# Patient Record
Sex: Female | Born: 1965 | Race: White | Hispanic: No | Marital: Married | Smoking: Current every day smoker
Health system: Southern US, Community
[De-identification: ages and names within clinical notes are randomized; demographics above are authoritative.]

## PROBLEM LIST (undated history)

## (undated) DIAGNOSIS — J189 Pneumonia, unspecified organism: Secondary | ICD-10-CM

## (undated) DIAGNOSIS — K219 Gastro-esophageal reflux disease without esophagitis: Secondary | ICD-10-CM

## (undated) DIAGNOSIS — J449 Chronic obstructive pulmonary disease, unspecified: Secondary | ICD-10-CM

## (undated) DIAGNOSIS — F32A Depression, unspecified: Secondary | ICD-10-CM

## (undated) DIAGNOSIS — F329 Major depressive disorder, single episode, unspecified: Secondary | ICD-10-CM

## (undated) DIAGNOSIS — Z87442 Personal history of urinary calculi: Secondary | ICD-10-CM

## (undated) DIAGNOSIS — R109 Unspecified abdominal pain: Secondary | ICD-10-CM

## (undated) DIAGNOSIS — Z8711 Personal history of peptic ulcer disease: Secondary | ICD-10-CM

## (undated) DIAGNOSIS — Z973 Presence of spectacles and contact lenses: Secondary | ICD-10-CM

## (undated) DIAGNOSIS — R112 Nausea with vomiting, unspecified: Secondary | ICD-10-CM

## (undated) DIAGNOSIS — N200 Calculus of kidney: Secondary | ICD-10-CM

## (undated) DIAGNOSIS — R350 Frequency of micturition: Secondary | ICD-10-CM

## (undated) DIAGNOSIS — F419 Anxiety disorder, unspecified: Secondary | ICD-10-CM

## (undated) DIAGNOSIS — Z8719 Personal history of other diseases of the digestive system: Secondary | ICD-10-CM

## (undated) DIAGNOSIS — R319 Hematuria, unspecified: Secondary | ICD-10-CM

## (undated) DIAGNOSIS — Z9889 Other specified postprocedural states: Secondary | ICD-10-CM

## (undated) DIAGNOSIS — N201 Calculus of ureter: Secondary | ICD-10-CM

## (undated) DIAGNOSIS — Z8489 Family history of other specified conditions: Secondary | ICD-10-CM

## (undated) DIAGNOSIS — H409 Unspecified glaucoma: Secondary | ICD-10-CM

## (undated) DIAGNOSIS — R3915 Urgency of urination: Secondary | ICD-10-CM

## (undated) HISTORY — PX: FOOT SURGERY: SHX648

## (undated) HISTORY — PX: DILATION AND CURETTAGE OF UTERUS: SHX78

## (undated) HISTORY — PX: TONSILLECTOMY AND ADENOIDECTOMY: SUR1326

## (undated) HISTORY — PX: COLONOSCOPY: SHX174

## (undated) HISTORY — DX: Unspecified glaucoma: H40.9

---

## 1996-10-10 HISTORY — PX: CYSTOSCOPY/RETROGRADE/URETEROSCOPY/STONE EXTRACTION WITH BASKET: SHX5317

## 2000-10-10 HISTORY — PX: APPENDECTOMY: SHX54

## 2008-10-10 HISTORY — PX: OTHER SURGICAL HISTORY: SHX169

## 2008-10-10 HISTORY — PX: VAGINAL HYSTERECTOMY: SUR661

## 2013-08-21 ENCOUNTER — Ambulatory Visit (INDEPENDENT_AMBULATORY_CARE_PROVIDER_SITE_OTHER): Payer: BC Managed Care – PPO | Admitting: Psychiatry

## 2013-08-21 ENCOUNTER — Encounter (HOSPITAL_COMMUNITY): Payer: Self-pay | Admitting: Psychiatry

## 2013-08-21 VITALS — BP 130/84 | Ht 63.0 in | Wt 118.0 lb

## 2013-08-21 DIAGNOSIS — F1010 Alcohol abuse, uncomplicated: Secondary | ICD-10-CM

## 2013-08-21 DIAGNOSIS — F4310 Post-traumatic stress disorder, unspecified: Secondary | ICD-10-CM
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
## 2013-09-18 ENCOUNTER — Ambulatory Visit (HOSPITAL_COMMUNITY): Payer: BC Managed Care – PPO | Admitting: Psychiatry

## 2015-08-11 ENCOUNTER — Other Ambulatory Visit (HOSPITAL_COMMUNITY): Payer: Self-pay | Admitting: Family Medicine

## 2015-08-11 DIAGNOSIS — Z1231 Encounter for screening mammogram for malignant neoplasm of breast: Secondary | ICD-10-CM

## 2015-08-24 ENCOUNTER — Ambulatory Visit (HOSPITAL_COMMUNITY): Payer: Self-pay

## 2015-09-09 ENCOUNTER — Ambulatory Visit (HOSPITAL_COMMUNITY): Payer: Self-pay

## 2017-01-09 ENCOUNTER — Emergency Department (HOSPITAL_COMMUNITY): Payer: Commercial Managed Care - HMO

## 2017-01-09 ENCOUNTER — Emergency Department (HOSPITAL_COMMUNITY)
Admission: EM | Admit: 2017-01-09 | Discharge: 2017-01-09 | Disposition: A | Discharge status: Home / Self Care | Payer: Commercial Managed Care - HMO | Attending: Emergency Medicine | Admitting: Emergency Medicine

## 2017-01-09 ENCOUNTER — Encounter (HOSPITAL_COMMUNITY): Payer: Self-pay | Admitting: Emergency Medicine

## 2017-01-09 DIAGNOSIS — N390 Urinary tract infection, site not specified: Secondary | ICD-10-CM | POA: Insufficient documentation

## 2017-01-09 DIAGNOSIS — R319 Hematuria, unspecified: Secondary | ICD-10-CM

## 2017-01-09 DIAGNOSIS — N201 Calculus of ureter: Secondary | ICD-10-CM | POA: Insufficient documentation

## 2017-01-09 DIAGNOSIS — R109 Unspecified abdominal pain: Secondary | ICD-10-CM | POA: Diagnosis present

## 2017-01-09 DIAGNOSIS — Z87891 Personal history of nicotine dependence: Secondary | ICD-10-CM | POA: Diagnosis not present

## 2017-01-09 DIAGNOSIS — J449 Chronic obstructive pulmonary disease, unspecified: Secondary | ICD-10-CM | POA: Diagnosis not present

## 2017-01-09 LAB — URINALYSIS, ROUTINE W REFLEX MICROSCOPIC
Bilirubin Urine: NEGATIVE
GLUCOSE, UA: 250 mg/dL — AB
Nitrite: POSITIVE — AB
PH: 6.5 (ref 5.0–8.0)
Protein, ur: >300 mg/dL — AB
Specific Gravity, Urine: >1.03 — ABNORMAL HIGH (ref 1.005–1.030)

## 2017-01-09 LAB — URINALYSIS, MICROSCOPIC (REFLEX)

## 2017-01-09 MED ORDER — OXYCODONE-ACETAMINOPHEN 5-325 MG PO TABS: 1.0000 | ORAL_TABLET | ORAL | 0 refills | Status: DC | PRN

## 2017-01-09 NOTE — ED Triage Notes (Signed)
Pt dx with kidney stone that she has not passed yet on New years this year. Went to urgent care Saturday and was told urine culture was clear and needing to come for CT scan to make sure no blockage. Pt c/o left falnk pain intermittent.

## 2017-01-09 NOTE — ED Provider Notes (Signed)
[facility] DEPT Provider Note   CSN: [id number] Arrival date & time: 01/09/17  4098   By signing my name below, I, Marnette Burgess Long, attest that this documentation has been prepared under the direction and in the presence of Margarita Grizzle, MD. Electronically Signed: Marnette Burgess Long, Scribe. 01/09/2017. 1:24 PM.   History   Chief Complaint Chief Complaint  Patient presents with  . Flank Pain   The history is provided by the patient and medical records. No language interpreter was used.    HPI Comments:  Connie Vargas is a 51 y.o. female with a PMHx of renal calculi, who presents to the Emergency Department complaining of intermittent left-sided flank pain onset four months ago. She reports the pain beginning, and at its worst, on  Years Eve which she has not been seen since for since that time. She has intermittent hematuria, dysuria and flank pain since onset of original symptoms. Two days ago the hematuria and dysuria began again thus she was seen at [facility] who did a UA and referred her here for CT stated "no growth in the culture, the stone may be too large to pass" She was referred her eby them for a CT. Pt has associated symptoms of nausea, difficulty urinating, Her husband reports increased fluid intake but no reduced PO intake. She is not currently in any pain but states "a lot of discomfort". Pt denies any other complaints at this time. Pt does not smoke or ingest EtOH. LMP~2010 following a hysterectomy   Urologist: last seen [facility] in [place].    Past Medical History:  Diagnosis Date  . Glaucoma   . Kidney stone     Patient Active Problem List   Diagnosis Date Noted  . PTSD (post-traumatic stress disorder) 08/21/2013   Past Surgical History:  Procedure Laterality Date  . ABDOMINAL HYSTERECTOMY    . APPENDECTOMY    . orthopedic surgery feet     OB History    No data available      Home Medications    Prior to Admission medications   Not on File     Family History Family History  Problem Relation Age of Onset  . Depression Mother   . Bipolar disorder Father     Social History Social History  Substance Use Topics  . Smoking status: Former Smoker    Packs/day: 1.00    Quit date: 10/10/2016  . Smokeless tobacco: Never Used  . Alcohol use Yes     Comment: occ     Allergies   Patient has no known allergies.   Review of Systems Review of Systems  Genitourinary: Positive for difficulty urinating, flank pain and hematuria.  All other systems reviewed and are negative.    Physical Exam Updated Vital Signs BP 120/79 (BP Location: Left Arm)   Pulse 100   Temp 98.4 F (36.9 C) (Oral)   Resp 18   Ht  (1.6 m)   Wt 115 lb (52.2 kg)   SpO2 97%   BMI 20.37 kg/m   Physical Exam  Constitutional: She is oriented to person, place, and time. She appears well-developed and well-nourished. No distress.  HENT:  Head: Normocephalic and atraumatic.  Right Ear: External ear normal.  Left Ear: External ear normal.  Nose: Nose normal.  Eyes: Conjunctivae and EOM are normal. Pupils are equal, round, and reactive to light.  Neck: Normal range of motion. Neck supple.  Cardiovascular: Normal rate, regular rhythm and normal heart sounds.   Pulmonary/Chest:  Effort normal.  Abdominal: Soft. Bowel sounds are normal.  Musculoskeletal: Normal range of motion.  Mild right cva ttp  Neurological: She is alert and oriented to person, place, and time. She exhibits normal muscle tone. Coordination normal.  Skin: Skin is warm and dry.  Psychiatric: She has a normal mood and affect. Her behavior is normal. Thought content normal.  Nursing note and vitals reviewed.    ED Treatments / Results  DIAGNOSTIC STUDIES:  Oxygen Saturation is 97% on RA, normal by my interpretation.    COORDINATION OF CARE:  1:23 PM Discussed treatment plan with pt at bedside including UA, Percocet, and CT Renal with renal calculi study and pt agreed to  plan.  Labs (all labs ordered are listed, but only abnormal results are displayed) Labs Reviewed  URINALYSIS, ROUTINE W REFLEX MICROSCOPIC - Abnormal; Notable for the following:       Result Value   Color, Urine ORANGE (*)    Specific Gravity, Urine >1.030 (*)    Glucose, UA 250 (*)    Hgb urine dipstick SMALL (*)    Ketones, ur TRACE (*)    Protein, ur >300 (*)    Nitrite POSITIVE (*)    Leukocytes, UA MODERATE (*)    All other components within normal limits  URINALYSIS, MICROSCOPIC (REFLEX) - Abnormal; Notable for the following:    Bacteria, UA MANY (*)    Squamous Epithelial / LPF 0-5 (*)    All other components within normal limits    EKG  EKG Interpretation None       Radiology Ct Renal Stone Study  Result Date: 01/09/2017 CLINICAL DATA:  Intermittent left flank pain EXAM: CT ABDOMEN AND PELVIS WITHOUT CONTRAST TECHNIQUE: Multidetector CT imaging of the abdomen and pelvis was performed following the standard protocol without oral or intravenous contrast material administration. COMPARISON:  None. FINDINGS: Lower chest: Lung bases are clear. Hepatobiliary: Liver measures 18.6 cm in length. No focal liver lesions are evident on this noncontrast enhanced study. There is a tiny gallstone in the gallbladder with mild sludge. Gallbladder wall does not appear thickened. There is no biliary duct dilatation. Pancreas: No pancreatic mass or inflammatory focus. Spleen: No splenic lesions are evident. Adrenals/Urinary Tract: Right adrenal appears normal. There is slight left adrenal hypertrophy. There is mild nephrocalcinosis bilaterally. There is no mass or hydronephrosis on either side. On the right, there is a 1 mm calculus in the anterior aspect of the mid right kidney. There is a 1 mm calculus in the mid right kidney as well as a 2 mm calculus in the posterior mid right kidney. On the left, there are multiple 1-2 mm calculi throughout the kidney as well as a 4 x 3 mm calculus in the  upper pole left kidney. There is no evident mass or hydronephrosis on either side. There is a focal calculus in the ureterovesical junction on the left measuring 7 x 6 mm which is not causing appreciable hydronephrosis. No other ureteral calculus is evident. The urinary bladder is midline with wall thickness within normal limits. Stomach/Bowel: There are multiple sigmoid diverticula without diverticulitis. There is no appreciable bowel wall or mesenteric thickening. No bowel obstruction. No free air or portal venous air is evident. Vascular/Lymphatic: There is atherosclerotic calcification in the aorta and common iliac arteries without evident aneurysm. Major mesenteric vessels appear patent on this noncontrast enhanced study. No adenopathy is appreciable in the abdomen or pelvis. Reproductive: Uterus is absent. No pelvic mass or pelvic fluid collection. Other:  Appendix absent. No abscess or ascites evident in the abdomen or pelvis. There is a minimal ventral hernia containing only fat. Musculoskeletal: There are no blastic or lytic bone lesions. There is degenerative change in the lower lumbar spine. There is a small bone island in the left femoral head. There are no intramuscular or abdominal wall lesions. IMPRESSION: 7 x 6 mm calculus left ureterovesical junction without appreciable hydronephrosis. There are multiple intrarenal calculi bilaterally, more on the left than on the right. There is also nephrocalcinosis bilaterally. There is no perinephric edema or stranding. Prominent liver without focal liver lesion. Tiny gallstone as well as probable sludge in gallbladder. Gallbladder wall not thickened. Minimal ventral hernia containing only fat. Aortoiliac atherosclerosis. No bowel obstruction. No abscess. Appendix absent. Uterus absent. There are sigmoid diverticula without diverticulitis. Electronically Signed   By: Bretta Bang III M.D.   On: 01/09/2017 11:32    Procedures Procedures (including  critical care time)  Medications Ordered in ED Medications - No data to display   Initial Impression / Assessment and Plan / ED Course  I have reviewed the triage vital signs and the nursing notes.  Pertinent labs & imaging results that were available during my care of the patient were reviewed by me and considered in my medical decision making (see chart for details).     This is a 51 year old female history of kidney sounds to comes in today with several months of left flank pain and hematuria. She was started on Cipro several days ago at [facility]. He told her that the culture was negative. Here she has a left UVJ stone that appears to large to pass, but does not appear to have any hydronephrosis. Urinalysis reveals is too numerous to count red blood cells and white blood cells in her urine. She is advised to continue the Cipro. The pain is waxing and waning over several months and has not experienced this time. She is given 6 Percocet to use as needed when pain worsens. She has Zofran at home. She has a urologist in [place] but has not seen him for several years. She is given a referral to [facility] to follow-up if she chooses and is unable to get into her urologist in [place].   Final Clinical Impressions(s) / ED Diagnoses   Final diagnoses:  Left ureteral stone  Urinary tract infection with hematuria, site unspecified    New Prescriptions New Prescriptions   OXYCODONE-ACETAMINOPHEN (PERCOCET/ROXICET) 5-325 MG TABLET    Take 1 tablet by mouth every 4 (four) hours as needed for severe pain.   I personally performed the services described in this documentation, which was scribed in my presence. The recorded information has been reviewed and considered.    Margarita Grizzle, MD 01/09/17 [phone]

## 2017-01-11 ENCOUNTER — Other Ambulatory Visit: Payer: Self-pay | Admitting: Urology

## 2017-01-13 ENCOUNTER — Encounter (HOSPITAL_BASED_OUTPATIENT_CLINIC_OR_DEPARTMENT_OTHER)
Admission: RE | Disposition: A | Discharge status: Home / Self Care | Payer: Self-pay | Source: Ambulatory Visit | Attending: Urology

## 2017-01-13 ENCOUNTER — Encounter (HOSPITAL_BASED_OUTPATIENT_CLINIC_OR_DEPARTMENT_OTHER): Payer: Self-pay | Admitting: *Deleted

## 2017-01-13 ENCOUNTER — Ambulatory Visit (HOSPITAL_BASED_OUTPATIENT_CLINIC_OR_DEPARTMENT_OTHER): Payer: Commercial Managed Care - HMO | Admitting: Anesthesiology

## 2017-01-13 ENCOUNTER — Other Ambulatory Visit: Payer: Self-pay | Admitting: Urology

## 2017-01-13 ENCOUNTER — Ambulatory Visit (HOSPITAL_BASED_OUTPATIENT_CLINIC_OR_DEPARTMENT_OTHER)
Admission: RE | Admit: 2017-01-13 | Discharge: 2017-01-13 | Disposition: A | Discharge status: Home / Self Care | Payer: Commercial Managed Care - HMO | Source: Ambulatory Visit | Attending: Urology | Admitting: Urology

## 2017-01-13 DIAGNOSIS — Z87891 Personal history of nicotine dependence: Secondary | ICD-10-CM | POA: Diagnosis not present

## 2017-01-13 DIAGNOSIS — Z79899 Other long term (current) drug therapy: Secondary | ICD-10-CM | POA: Diagnosis not present

## 2017-01-13 DIAGNOSIS — N201 Calculus of ureter: Secondary | ICD-10-CM | POA: Insufficient documentation

## 2017-01-13 DIAGNOSIS — K219 Gastro-esophageal reflux disease without esophagitis: Secondary | ICD-10-CM | POA: Insufficient documentation

## 2017-01-13 HISTORY — DX: Urgency of urination: R39.15

## 2017-01-13 HISTORY — DX: Personal history of other diseases of the digestive system: Z87.19

## 2017-01-13 HISTORY — DX: Personal history of urinary calculi: Z87.442

## 2017-01-13 HISTORY — DX: Family history of other specified conditions: Z84.89

## 2017-01-13 HISTORY — DX: Gastro-esophageal reflux disease without esophagitis: K21.9

## 2017-01-13 HISTORY — DX: Presence of spectacles and contact lenses: Z97.3

## 2017-01-13 HISTORY — DX: Calculus of ureter: N20.1

## 2017-01-13 HISTORY — DX: Hematuria, unspecified: R31.9

## 2017-01-13 HISTORY — DX: Personal history of peptic ulcer disease: Z87.11

## 2017-01-13 HISTORY — PX: URETEROSCOPY: SHX842

## 2017-01-13 HISTORY — DX: Unspecified abdominal pain: R10.9

## 2017-01-13 HISTORY — DX: Calculus of kidney: N20.0

## 2017-01-13 HISTORY — DX: Frequency of micturition: R35.0

## 2017-01-13 HISTORY — DX: Chronic obstructive pulmonary disease, unspecified: J44.9

## 2017-01-13 LAB — POCT HEMOGLOBIN-HEMACUE: Hemoglobin: 11.5 g/dL — ABNORMAL LOW (ref 12.0–15.0)

## 2017-01-13 SURGERY — URETEROSCOPY
Anesthesia: General | Laterality: Left

## 2017-01-13 MED ORDER — DEXAMETHASONE SODIUM PHOSPHATE 10 MG/ML IJ SOLN
INTRAMUSCULAR | Status: AC
  Filled 2017-01-13: qty 1

## 2017-01-13 MED ORDER — MIDAZOLAM HCL 5 MG/5ML IJ SOLN
INTRAMUSCULAR | Status: DC | PRN
  Administered 2017-01-13: 2 mg via INTRAVENOUS

## 2017-01-13 MED ORDER — FENTANYL CITRATE (PF) 100 MCG/2ML IJ SOLN
25.0000 ug | INTRAMUSCULAR | Status: DC | PRN
Start: 2017-01-13 — End: 2017-01-13
  Filled 2017-01-13: qty 1

## 2017-01-13 MED ORDER — FENTANYL CITRATE (PF) 100 MCG/2ML IJ SOLN
INTRAMUSCULAR | Status: AC
  Filled 2017-01-13: qty 2

## 2017-01-13 MED ORDER — SODIUM CHLORIDE 0.9% FLUSH
3.0000 mL | Freq: Two times a day (BID) | INTRAVENOUS | Status: DC
  Filled 2017-01-13: qty 3

## 2017-01-13 MED ORDER — ACETAMINOPHEN 650 MG RE SUPP
650.0000 mg | RECTAL | Status: DC | PRN
  Filled 2017-01-13: qty 1

## 2017-01-13 MED ORDER — ONDANSETRON HCL 4 MG/2ML IJ SOLN
INTRAMUSCULAR | Status: AC
  Filled 2017-01-13: qty 2

## 2017-01-13 MED ORDER — ONDANSETRON HCL 4 MG/2ML IJ SOLN
INTRAMUSCULAR | Status: DC | PRN
  Administered 2017-01-13: 4 mg via INTRAVENOUS

## 2017-01-13 MED ORDER — FENTANYL CITRATE (PF) 100 MCG/2ML IJ SOLN
25.0000 ug | INTRAMUSCULAR | Status: DC | PRN
  Administered 2017-01-13: 25 ug via INTRAVENOUS
  Filled 2017-01-13: qty 1

## 2017-01-13 MED ORDER — LIDOCAINE 2% (20 MG/ML) 5 ML SYRINGE
INTRAMUSCULAR | Status: AC
  Filled 2017-01-13: qty 5

## 2017-01-13 MED ORDER — LACTATED RINGERS IV SOLN
INTRAVENOUS | Status: DC
  Administered 2017-01-13 (×2): via INTRAVENOUS
  Filled 2017-01-13: qty 1000

## 2017-01-13 MED ORDER — CEFAZOLIN SODIUM-DEXTROSE 2-4 GM/100ML-% IV SOLN
2.0000 g | INTRAVENOUS | Status: AC
  Administered 2017-01-13: 2 g via INTRAVENOUS
  Filled 2017-01-13: qty 100

## 2017-01-13 MED ORDER — SODIUM CHLORIDE 0.9 % IV SOLN
250.0000 mL | INTRAVENOUS | Status: DC | PRN
  Filled 2017-01-13: qty 250

## 2017-01-13 MED ORDER — ACETAMINOPHEN 325 MG PO TABS
650.0000 mg | ORAL_TABLET | ORAL | Status: DC | PRN
  Filled 2017-01-13: qty 2

## 2017-01-13 MED ORDER — MIDAZOLAM HCL 2 MG/2ML IJ SOLN
INTRAMUSCULAR | Status: AC
  Filled 2017-01-13: qty 2

## 2017-01-13 MED ORDER — SODIUM CHLORIDE 0.9% FLUSH
3.0000 mL | INTRAVENOUS | Status: DC | PRN
Start: 2017-01-13 — End: 2017-01-13
  Filled 2017-01-13: qty 3

## 2017-01-13 MED ORDER — PROPOFOL 10 MG/ML IV BOLUS
INTRAVENOUS | Status: DC | PRN
  Administered 2017-01-13: 40 mg via INTRAVENOUS
  Administered 2017-01-13: 110 mg via INTRAVENOUS

## 2017-01-13 MED ORDER — CEFAZOLIN SODIUM-DEXTROSE 2-4 GM/100ML-% IV SOLN
INTRAVENOUS | Status: AC
  Filled 2017-01-13: qty 100

## 2017-01-13 MED ORDER — FENTANYL CITRATE (PF) 100 MCG/2ML IJ SOLN
INTRAMUSCULAR | Status: DC | PRN
  Administered 2017-01-13 (×2): 50 ug via INTRAVENOUS

## 2017-01-13 MED ORDER — OXYCODONE HCL 5 MG PO TABS
5.0000 mg | ORAL_TABLET | ORAL | Status: DC | PRN
  Filled 2017-01-13: qty 2

## 2017-01-13 MED ORDER — EPHEDRINE SULFATE 50 MG/ML IJ SOLN
INTRAMUSCULAR | Status: DC | PRN
  Administered 2017-01-13: 5 mg via INTRAVENOUS
  Administered 2017-01-13: 10 mg via INTRAVENOUS

## 2017-01-13 MED ORDER — BELLADONNA ALKALOIDS-OPIUM 16.2-60 MG RE SUPP
RECTAL | Status: AC
  Filled 2017-01-13: qty 1

## 2017-01-13 MED ORDER — LIDOCAINE 2% (20 MG/ML) 5 ML SYRINGE
INTRAMUSCULAR | Status: DC | PRN
  Administered 2017-01-13: 70 mg via INTRAVENOUS

## 2017-01-13 MED ORDER — PROPOFOL 10 MG/ML IV BOLUS
INTRAVENOUS | Status: AC
  Filled 2017-01-13: qty 40

## 2017-01-13 MED ORDER — DEXAMETHASONE SODIUM PHOSPHATE 10 MG/ML IJ SOLN
INTRAMUSCULAR | Status: DC | PRN
  Administered 2017-01-13: 10 mg via INTRAVENOUS

## 2017-01-13 MED ORDER — BELLADONNA ALKALOIDS-OPIUM 16.2-60 MG RE SUPP
1.0000 | Freq: Every day | RECTAL | Status: DC
  Administered 2017-01-13: 1 via RECTAL
  Filled 2017-01-13: qty 1

## 2017-01-13 SURGICAL SUPPLY — 34 items
BAG DRAIN URO-CYSTO SKYTR STRL (DRAIN) ×3 IMPLANT
BASKET LASER NITINOL 1.9FR (BASKET) IMPLANT
BASKET STNLS GEMINI 4WIRE 3FR (BASKET) IMPLANT
BASKET STONE 1.7 NGAGE (UROLOGICAL SUPPLIES) ×3 IMPLANT
BASKET ZERO TIP NITINOL 2.4FR (BASKET) IMPLANT
CATH URET 5FR 28IN CONE TIP (BALLOONS)
CATH URET 5FR 28IN OPEN ENDED (CATHETERS) IMPLANT
CATH URET 5FR 70CM CONE TIP (BALLOONS) IMPLANT
CLOTH BEACON ORANGE TIMEOUT ST (SAFETY) ×3 IMPLANT
ELECT REM PT RETURN 9FT ADLT (ELECTROSURGICAL)
ELECTRODE REM PT RTRN 9FT ADLT (ELECTROSURGICAL) IMPLANT
FIBER LASER FLEXIVA 1000 (UROLOGICAL SUPPLIES) IMPLANT
FIBER LASER FLEXIVA 365 (UROLOGICAL SUPPLIES) ×3 IMPLANT
FIBER LASER FLEXIVA 550 (UROLOGICAL SUPPLIES) IMPLANT
FIBER LASER TRAC TIP (UROLOGICAL SUPPLIES) IMPLANT
GLOVE SURG SS PI 8.0 STRL IVOR (GLOVE) ×3 IMPLANT
GOWN STRL REUS W/ TWL LRG LVL3 (GOWN DISPOSABLE) ×1 IMPLANT
GOWN STRL REUS W/ TWL XL LVL3 (GOWN DISPOSABLE) ×1 IMPLANT
GOWN STRL REUS W/TWL LRG LVL3 (GOWN DISPOSABLE) ×2
GOWN STRL REUS W/TWL XL LVL3 (GOWN DISPOSABLE) ×2
GUIDEWIRE 0.038 PTFE COATED (WIRE) IMPLANT
GUIDEWIRE ANG ZIPWIRE 038X150 (WIRE) IMPLANT
GUIDEWIRE STR DUAL SENSOR (WIRE) ×3 IMPLANT
IV NS IRRIG 3000ML ARTHROMATIC (IV SOLUTION) ×6 IMPLANT
KIT BALLIN UROMAX 15FX10 (LABEL) IMPLANT
KIT BALLN UROMAX 15FX4 (MISCELLANEOUS) IMPLANT
KIT BALLN UROMAX 26 75X4 (MISCELLANEOUS)
KIT RM TURNOVER CYSTO AR (KITS) ×3 IMPLANT
MANIFOLD NEPTUNE II (INSTRUMENTS) IMPLANT
PACK CYSTO (CUSTOM PROCEDURE TRAY) ×3 IMPLANT
SET HIGH PRES BAL DIL (LABEL)
SHEATH ACCESS URETERAL 38CM (SHEATH) IMPLANT
TUBE CONNECTING 12'X1/4 (SUCTIONS)
TUBE CONNECTING 12X1/4 (SUCTIONS) IMPLANT

## 2017-01-13 NOTE — Brief Op Note (Signed)
01/13/2017  4:00 PM  PATIENT:  Connie Vargas  51 y.o. female  PRE-OPERATIVE DIAGNOSIS:  left uvj stone  POST-OPERATIVE DIAGNOSIS:  same PROCEDURE:  Procedure(s) with comments: URETEROSCOPY c laser (Left) - left ureteroscopy with laser laser c arm  SURGEON:  Surgeon(s) and Role:    * Bjorn Pippin, MD - Primary  PHYSICIAN ASSISTANT:   ASSISTANTS: none   ANESTHESIA:   general  EBL:  Total I/O In: 1852.5 [P.O.:720; I.V.:1132.5] Out: 405 [Urine:400; Blood:5]  BLOOD ADMINISTERED:none  DRAINS: none   LOCAL MEDICATIONS USED:  NONE  SPECIMEN:  Source of Specimen:  left UVJ stone  DISPOSITION OF SPECIMEN:  to patient to bring to office  COUNTS:  YES  TOURNIQUET:  * No tourniquets in log *  DICTATION: .Other Dictation: Dictation Number [id number]  PLAN OF CARE: Discharge to home after PACU  PATIENT DISPOSITION:  PACU - hemodynamically stable.   Delay start of Pharmacological VTE agent (>24hrs) due to surgical blood loss or risk of bleeding: not applicable

## 2017-01-13 NOTE — Anesthesia Postprocedure Evaluation (Signed)
Anesthesia Post Note  Patient: Connie Vargas  Procedure(s) Performed: Procedure(s) (LRB): URETEROSCOPY c laser (Left)  Patient location during evaluation: PACU Anesthesia Type: General Level of consciousness: awake and alert Pain management: pain level controlled Vital Signs Assessment: post-procedure vital signs reviewed and stable Respiratory status: spontaneous breathing, nonlabored ventilation, respiratory function stable and patient connected to nasal cannula oxygen Cardiovascular status: blood pressure returned to baseline and stable Postop Assessment: no signs of nausea or vomiting Anesthetic complications: no       Last Vitals:  Vitals:   01/13/17 1345 01/13/17 1400  BP: 140/80 (!) 143/90  Pulse: (!) 101 93  Resp: 17 19  Temp:      Last Pain:  Vitals:   01/13/17 1338  TempSrc:   PainSc: 0-No pain                 [name] EDWARD

## 2017-01-13 NOTE — Transfer of Care (Signed)
Immediate Anesthesia Transfer of Care Note  Patient: Connie Vargas  Procedure(s) Performed: Procedure(s) with comments: URETEROSCOPY c laser (Left) - left ureteroscopy with laser laser c arm  Patient Location: PACU  Anesthesia Type:General  Level of Consciousness: awake, alert , oriented and patient cooperative  Airway & Oxygen Therapy: Patient Spontanous Breathing and Patient connected to nasal cannula oxygen  Post-op Assessment: Report given to RN and Post -op Vital signs reviewed and stable  Post vital signs: Reviewed and stable  Last Vitals:  Vitals:   01/13/17 1158  BP: (!) 120/58  Pulse: 75  Resp: 16  Temp: 36.9 C    Last Pain:  Vitals:   01/13/17 1229  TempSrc:   PainSc: 5       Patients Stated Pain Goal: 7 (01/13/17 1229)  Complications: No apparent anesthesia complications

## 2017-01-13 NOTE — Anesthesia Preprocedure Evaluation (Addendum)
Anesthesia Evaluation  Patient identified by MRN, date of birth, ID band Patient awake    Reviewed: Allergy & Precautions, H&P , Patient's Chart, lab work & pertinent test results, reviewed documented beta blocker date and time   Airway Mallampati: II  TM Distance: >3 FB Neck ROM: full    Dental no notable dental hx. (+) Teeth Intact, Dental Advisory Given   Pulmonary former smoker,    Pulmonary exam normal breath sounds clear to auscultation       Cardiovascular Exercise Tolerance: Good  Rhythm:regular Rate:Normal     Neuro/Psych    GI/Hepatic GERD  Medicated,  Endo/Other    Renal/GU Renal disease     Musculoskeletal   Abdominal   Peds  Hematology   Anesthesia Other Findings   Reproductive/Obstetrics                            Anesthesia Physical Anesthesia Plan  ASA: II  Anesthesia Plan: General   Post-op Pain Management:    Induction: Intravenous  Airway Management Planned: LMA  Additional Equipment:   Intra-op Plan:   Post-operative Plan: Extubation in OR  Informed Consent: I have reviewed the patients History and Physical, chart, labs and discussed the procedure including the risks, benefits and alternatives for the proposed anesthesia with the patient or authorized representative who has indicated his/her understanding and acceptance.   Dental Advisory Given  Plan Discussed with: CRNA and Surgeon  Anesthesia Plan Comments: ( )        Anesthesia Quick Evaluation

## 2017-01-13 NOTE — Anesthesia Procedure Notes (Signed)
Procedure Name: LMA Insertion Date/Time: 01/13/2017 12:55 PM Performed by: Tyrone Nine Pre-anesthesia Checklist: Patient identified, Emergency Drugs available, Suction available, Patient being monitored and Timeout performed Patient Re-evaluated:Patient Re-evaluated prior to inductionOxygen Delivery Method: Circle system utilized Preoxygenation: Pre-oxygenation with 100% oxygen Intubation Type: IV induction Ventilation: Mask ventilation without difficulty LMA: LMA inserted LMA Size: 4.0 Number of attempts: 1 Airway Equipment and Method: Bite block Placement Confirmation: breath sounds checked- equal and bilateral and positive ETCO2 Tube secured with: Tape Dental Injury: Teeth and Oropharynx as per pre-operative assessment

## 2017-01-13 NOTE — H&P (Signed)
CC/HPI: CC: Suprapubic pressure.   4/6/18[name] returns today with recurrent severe pressure in the abdomen. She was seen on 4/3 and was found to have 2-50m left ureteral and renal stones. She has passed some sand. She has some nausea but no fever.   01/10/17:1 - Recurrent Nephrolithiasis -  PRe 2018 - MET x several, URS x 1  01/2017 - Left distal ureter [id number] + Left intrarenal 35m735m 3 on ER CT eval few most left flank pain. UA here w/o infectious parameters.   2 - Medical Stone Disease -  Eval 2018 - BMP, PTH, Urate - pending; Composition - pending; 24 30 Urines - pending   PMH sig for TAH, TNA, Appy, GERD, Anxiety/Depression.   Today "Connie Vargas seen as new patient for above. Pain controlled, no fevers.     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 Has not smoked since 10/10/2016.   Tobacco Use Assessment Completed: Used Tobacco in last 30 days? Social Drinker.  Drinks 4+ caffeinated drinks per day.    REVIEW OF SYSTEMS:    GU Review Female:   Patient reports frequent urination, hard to postpone urination,  burning /pain with urination, get up at night to urinate, leakage of urine, stream starts and stops, and trouble starting your stream. Patient denies have to strain to urinate and currently pregnant.  Gastrointestinal (Upper):   Patient reports nausea. Patient denies vomiting and indigestion/ heartburn.  Gastrointestinal (Lower):   Patient denies constipation and diarrhea.  Constitutional:   Patient denies fever, night sweats, weight loss, and fatigue.  Skin:   Patient denies skin rash/ lesion and itching.  Eyes:   Patient denies blurred vision and double vision.  Ears/ Nose/ Throat:   Patient denies sore throat and sinus problems.  Hematologic/Lymphatic:   Patient denies swollen glands and easy bruising.  Cardiovascular:   Patient denies leg swelling and chest pains.  Respiratory:   Patient denies cough and shortness of breath.  Endocrine:   Patient denies excessive thirst.  Musculoskeletal:   Patient denies back pain and joint pain.  Neurological:   Patient denies headaches and dizziness.  Psychologic:   Patient denies depression and anxiety.   VITAL SIGNS:      01/13/2017 11:08 AM  BP 152/89 mmHg  Heart Rate 53 /min  Temperature 98.5 F / 37 C   MULTI-SYSTEM PHYSICAL EXAMINATION:    Constitutional: Well-nourished. No physical deformities. Normally developed. Good grooming. in discomfort  Respiratory: No labored breathing, no use of accessory muscles.   Cardiovascular:  RRR without murmur     PAST DATA REVIEWED:  Source Of History:  Patient  Urine Test Review:   Urinalysis  X-Ray Review: KUB: Reviewed Films. Discussed With Patient.  C.T. Stone Protocol: Reviewed Films. Discussed With Patient.     PROCEDURES:         KUB - [id number]  A single view of the abdomen is obtained. KUB shows a 59m LUVJ stone and a 2-343mLUP stone. No bone, gas or soft tissue abnormalities are noted.       stone hasn't passed.    ASSESSMENT:      ICD-10 Details  1 GU:   Calculus Kidney and Ureter -  N20.2 Worsening - She has increased pressure and discomfort and would like treatment of the stone. I will get her set up for ureteroscopy today and reviewed the risks of bleeding, infection, ureteral injury, need for stent and anesthetic complications.    PLAN:           Orders X-Rays: KUB          Schedule Return Visit/Planned Activity: ASAP - Schedule Surgery             Note: left ureteroscopy today.

## 2017-01-13 NOTE — Discharge Instructions (Addendum)
Post Anesthesia Home Care Instructions  Activity: Get plenty of rest for the remainder of the day. A responsible individual must stay with you for 24 hours following the procedure.  For the next 24 hours, DO NOT: -Drive a car -Advertising copywriter -Drink alcoholic beverages -Take any medication unless instructed by your physician -Make any legal decisions or sign important papers.  Meals: Start with liquid foods such as gelatin or soup. Progress to regular foods as tolerated. Avoid greasy, spicy, heavy foods. If nausea and/or vomiting occur, drink only clear liquids until the nausea and/or vomiting subsides. Call your physician if vomiting continues.  Special Instructions/Symptoms: Your throat may feel dry or sore from the anesthesia or the breathing tube placed in your throat during surgery. If this causes discomfort, gargle with warm salt water. The discomfort should disappear within 24 hours.  If you had a scopolamine patch placed behind your ear for the management of post- operative nausea and/or vomiting:  1. The medication in the patch is effective for 72 hours, after which it should be removed.  Wrap patch in a tissue and discard in the trash. Wash hands thoroughly with soap and water. 2. You may remove the patch earlier than 72 hours if you experience unpleasant side effects which may include dry mouth, dizziness or visual disturbances. 3. Avoid touching the patch. Wash your hands with soap and water after contact with the patch.   Ureteroscopy, Care After This sheet gives you information about how to care for yourself after your procedure. Your health care provider may also give you more specific instructions. If you have problems or questions, contact your health care provider. What can I expect after the procedure? After the procedure, it is common to have:  A burning sensation when you urinate.  Blood in your urine.  Mild discomfort in the bladder area or kidney area when  urinating.  Needing to urinate more often or urgently. Follow these instructions at home: Medicines   Take over-the-counter and prescription medicines only as told by your health care provider.  If you were prescribed an antibiotic medicine, take it as told by your health care provider. Do not stop taking the antibiotic even if you start to feel better. General instructions    Donot drive for 24 hours if you were given a medicine to help you relax (sedative) during your procedure.  To relieve burning, try taking a warm bath or holding a warm washcloth over your groin.  Drink enough fluid to keep your urine clear or pale yellow.  Drink two 8-ounce glasses of water every hour for the first 2 hours after you get home.  Continue to drink water often at home.  You can eat what you usually do.  Keep all follow-up visits as told by your health care provider. This is important.  If you had a tube placed to keep urine flowing (ureteral stent), ask your health care provider when you need to return to have it removed. Contact a health care provider if:  You have chills or a fever.  You have burning pain for longer than 24 hours after the procedure.  You have blood in your urine for longer than 24 hours after the procedure. Get help right away if:  You have large amounts of blood in your urine.  You have blood clots in your urine.  You have very bad pain.  You have chest pain or trouble breathing.  You are unable to urinate and you have the feeling  of a full bladder. This information is not intended to replace advice given to you by your health care provider. Make sure you discuss any questions you have with your health care provider. Document Released: 10/01/2013 Document Revised: 07/12/2016 Document Reviewed: 07/08/2016 Elsevier Interactive Patient Education  2017 [employer].

## 2017-01-16 ENCOUNTER — Encounter (HOSPITAL_BASED_OUTPATIENT_CLINIC_OR_DEPARTMENT_OTHER): Payer: Self-pay | Admitting: Urology

## 2017-01-16 NOTE — Op Note (Signed)
NAME:  Connie Vargas, KOENEMAN NO.:  MEDICAL RECORD NO.:  [id number]  LOCATION:                                 FACILITY:  PHYSICIAN:  Excell Seltzer. Annabell Howells, M.D.    DATE OF BIRTH:  06-25-1966  DATE OF PROCEDURE:  01/13/2017 DATE OF DISCHARGE:                              OPERATIVE REPORT   PROCEDURE:  Cystoscopy with left ureteroscopic stone extraction with holmium lasertripsy.  PREOPERATIVE DIAGNOSIS:  Left ureterovesical junction stone.  POSTOPERATIVE DIAGNOSIS:  Left ureterovesical junction stone.  SURGEON:  Excell Seltzer. Annabell Howells, M.D.  ANESTHESIA:  General.  SPECIMEN:  Stone fragments.  DRAINS:  None.  BLOOD LOSS:  None.  COMPLICATIONS:  None.  INDICATIONS:  [name] is a 51 year old white female, patient of Dr. Berneice Heinrich, who was found on April 2nd to have two 4 mm left UVJ stones on a CT scan.  There was obstruction and three 3 mm stones in the kidney. She was to be scheduled for ureteroscopy later this month and presented today with worsening symptoms and desired treatment.  FINDINGS OF PROCEDURE:  She was given Ancef.  She was taken to the operating room, where general anesthetic was induced.  She was placed in lithotomy position.  Her perineum and genitalia were prepped with Betadine solution.  She was draped in the usual sterile fashion.  Cystoscopy was performed using the 23-French scope and 30-degree lens. Examination revealed a normal urethra.  The bladder wall was generally smooth and pale without tumors or stones.  The right ureteral orifice was unremarkable.  The left ureteral orifice was somewhat edematous and erythematous with a stone visible at the meatus.  A Sensor guidewire was then passed through the ureteral meatus with the aid of a ureteral catheter.  The cystoscope and ureteral catheter were removed and the 4.5-French semi-rigid ureteroscope was then inserted. Passed a 364-micron laser fiber, which was initially set on 0.5 watts and 5  hertz and engaged the stone.  The power was increased to 1 as needed and the stone was broken into manageable fragments, that were then removed with the N-gage basket.  Final inspection revealed no residual stone material.  The wire was then removed.  Because her ureter appeared dilated above the stone, I elected to inspect the kidney using the single lumen digital flexible ureteroscope. This was passed without difficulty to the kidney.  All calyceal complexes were investigated.  She had a Randall's plaque, but no free stones that could be easily removed.  At this point, the ureteroscope was removed.  It was not felt a stent was indicated.  The cystoscope was reinserted.  The bladder was evacuated, free of stone fragments and the cystoscope was removed.  She was taken down from lithotomy position.  Her anesthetic was reversed.  She was moved to recovery room in stable condition.  There were no complications.     Excell Seltzer. Annabell Howells, M.D.     JJW/MEDQ  D:  01/13/2017  T:  01/13/2017  Job:  [id number]

## 2017-02-01 ENCOUNTER — Encounter (HOSPITAL_BASED_OUTPATIENT_CLINIC_OR_DEPARTMENT_OTHER): Admission: RE | Payer: Self-pay | Source: Ambulatory Visit

## 2017-02-01 ENCOUNTER — Ambulatory Visit (HOSPITAL_BASED_OUTPATIENT_CLINIC_OR_DEPARTMENT_OTHER): Admission: RE | Admit: 2017-02-01 | Payer: Commercial Managed Care - HMO | Source: Ambulatory Visit | Admitting: Urology

## 2017-02-01 SURGERY — CYSTOURETEROSCOPY, WITH RETROGRADE PYELOGRAM AND STENT INSERTION
Anesthesia: General | Laterality: Left

## 2017-04-21 NOTE — Addendum Note (Signed)
Addendum  created 04/21/17 1102 by Agnes Brightbill, MD   Sign clinical note    

## 2017-04-21 NOTE — Anesthesia Postprocedure Evaluation (Signed)
Anesthesia Post Note  Patient: Connie Vargas  Procedure(s) Performed: Procedure(s) (LRB): URETEROSCOPY c laser (Left)     Anesthesia Post Evaluation  Last Vitals:  Vitals:   01/13/17 1400 01/13/17 1515  BP: (!) 143/90 136/63  Pulse: 93 98  Resp: 19 19  Temp:  36.7 C    Last Pain:  Vitals:   01/16/17 1501  TempSrc:   PainSc: 0-No pain                 [name] EDWARD

## 2017-06-06 ENCOUNTER — Other Ambulatory Visit (HOSPITAL_COMMUNITY): Payer: Self-pay | Admitting: Internal Medicine

## 2017-06-06 DIAGNOSIS — Z780 Asymptomatic menopausal state: Secondary | ICD-10-CM

## 2017-06-08 ENCOUNTER — Other Ambulatory Visit: Payer: Self-pay | Admitting: Internal Medicine

## 2017-06-08 DIAGNOSIS — Z1231 Encounter for screening mammogram for malignant neoplasm of breast: Secondary | ICD-10-CM

## 2017-06-16 ENCOUNTER — Ambulatory Visit: Payer: Self-pay

## 2017-06-20 ENCOUNTER — Other Ambulatory Visit (HOSPITAL_COMMUNITY): Payer: Self-pay

## 2017-07-26 ENCOUNTER — Other Ambulatory Visit: Payer: Self-pay

## 2017-07-26 ENCOUNTER — Ambulatory Visit: Payer: Self-pay

## 2018-09-18 IMAGING — CT CT RENAL STONE PROTOCOL
2 of 3 series · 15 of 46 positions shown, 17 images · non-contrast
Comparison: None.

CLINICAL DATA: Intermittent left flank pain

EXAM:
CT ABDOMEN AND PELVIS WITHOUT CONTRAST
TECHNIQUE: Multidetector CT imaging of the abdomen and pelvis was performed
following the standard protocol without oral or intravenous contrast
material administration.

[Series 2: axial st · axial · 0.62mm/px · z∈[-415,-95]mm · 12 of 74 slices shown, 14 images]
[im 5/74  soft-tissue]
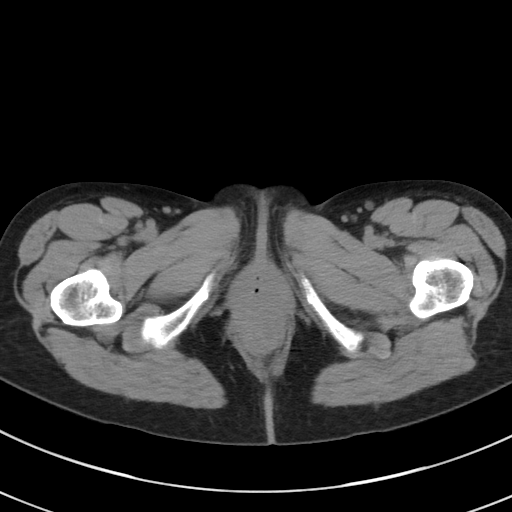
[im 5/74  bone]
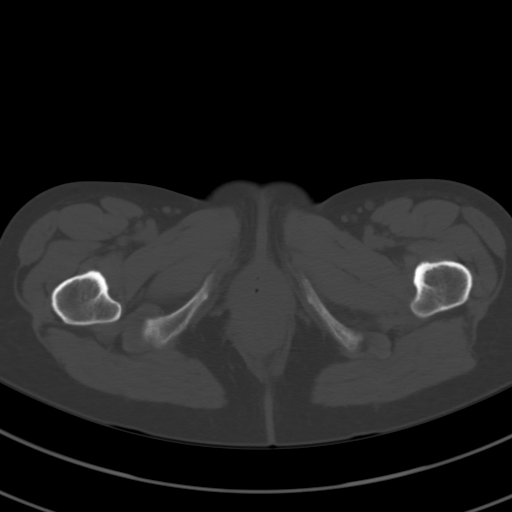
[im 10/74  soft-tissue]
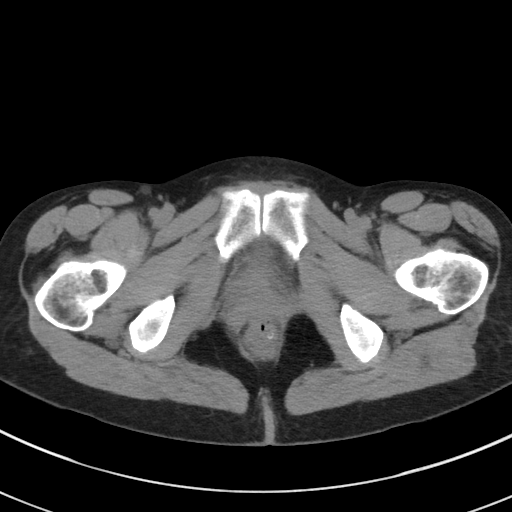
[im 17/74  soft-tissue]
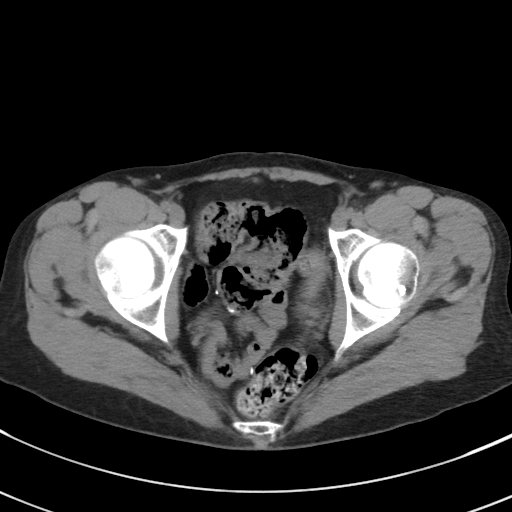
[im 22/74  soft-tissue]
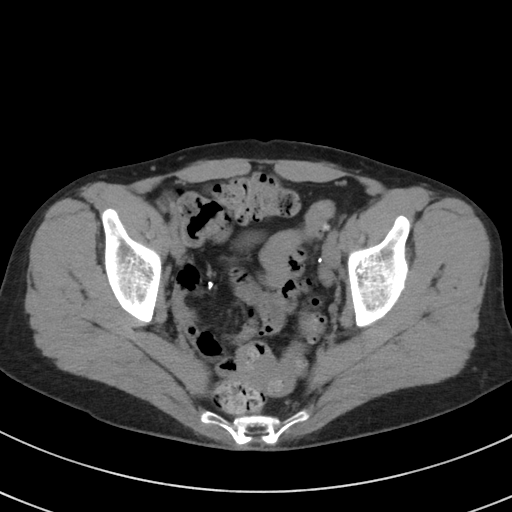
[im 29/74  soft-tissue]
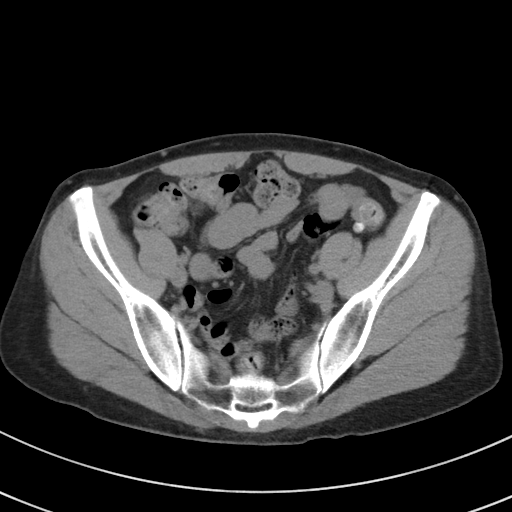
[im 33/74  soft-tissue]
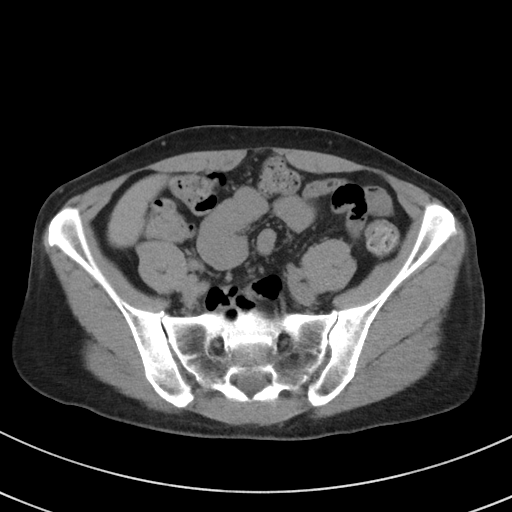
[im 41/74  soft-tissue]
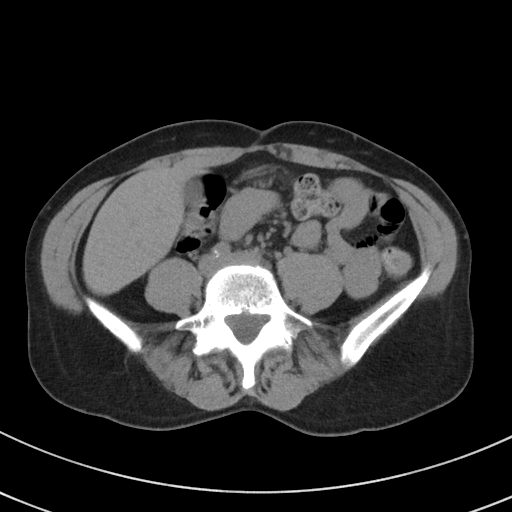
[im 45/74  soft-tissue]
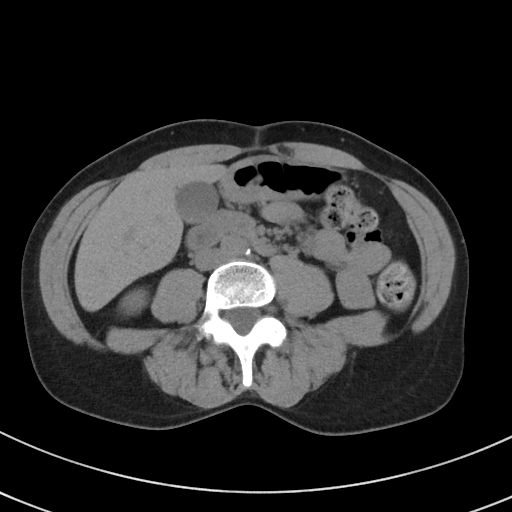
[im 52/74  soft-tissue]
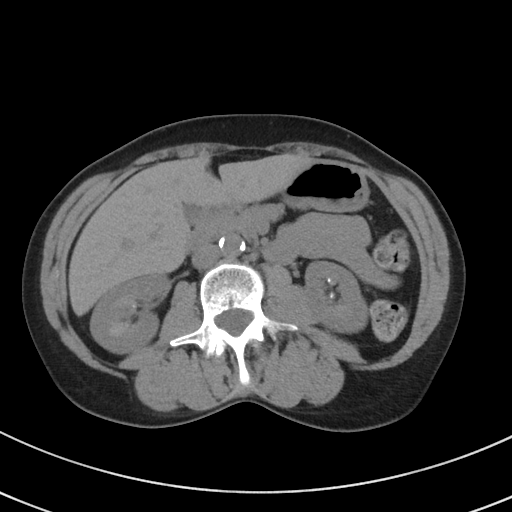
[im 52/74  bone]
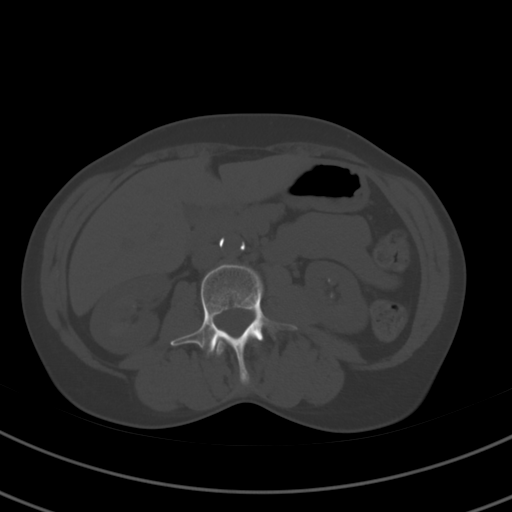
[im 57/74  soft-tissue]
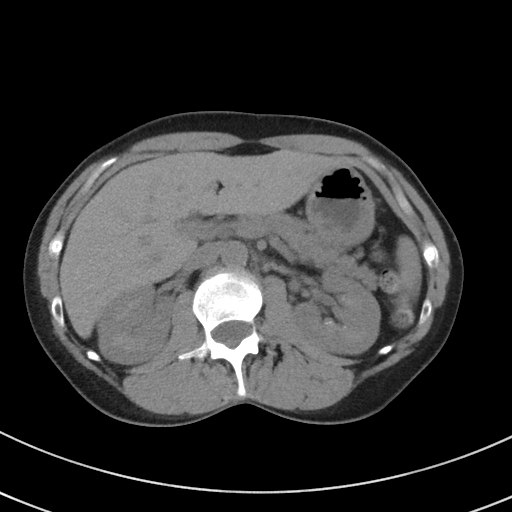
[im 64/74  soft-tissue]
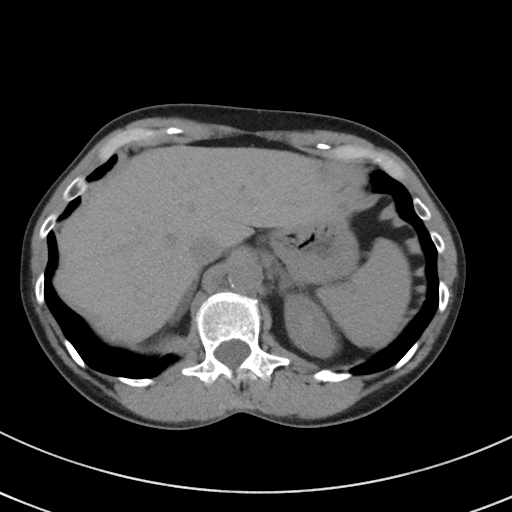
[im 69/74  soft-tissue]
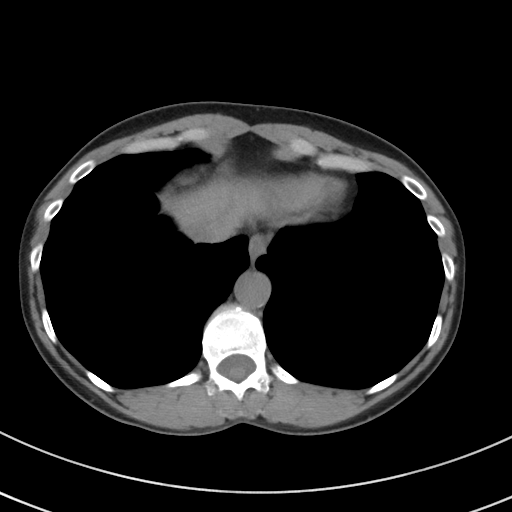

[Series 5: coronal st · coronal · 0.65mm/px · 3 of 72 slices shown]
[im 24/72  soft-tissue]
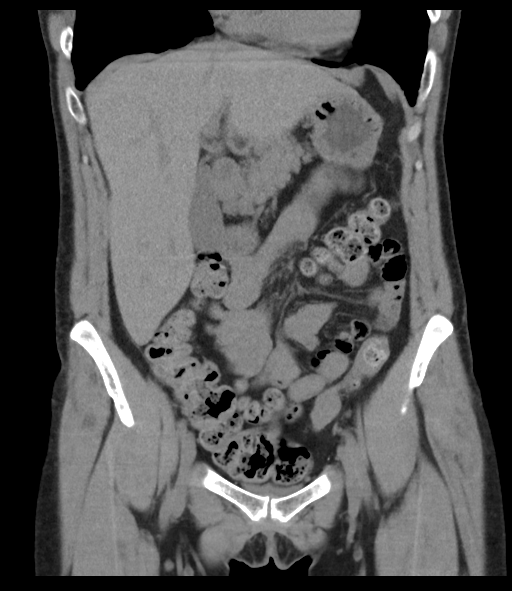
[im 32/72  soft-tissue]
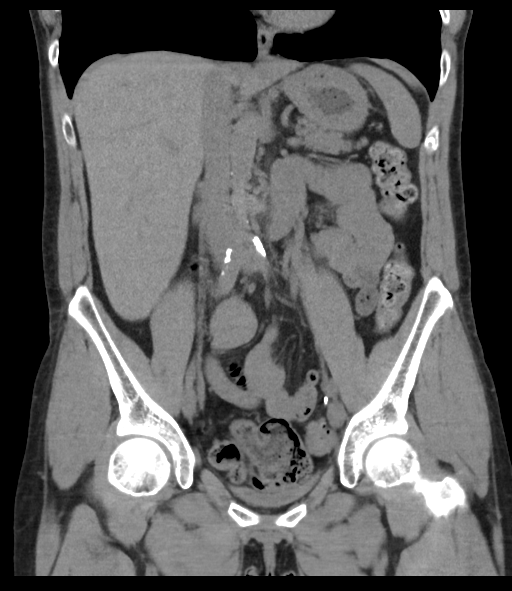
[im 40/72  soft-tissue]
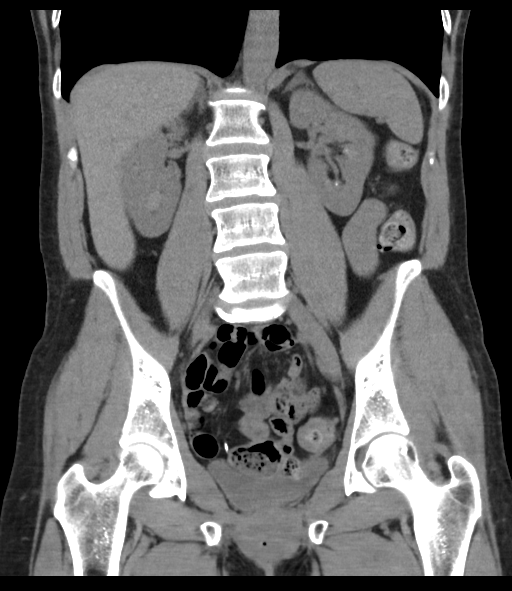

[15 of 46 positions shown; findings below may reference images not displayed]

FINDINGS: Lower chest: Lung bases are clear.

Hepatobiliary: Liver measures 18.6 cm in length. No focal liver
lesions are evident on this noncontrast enhanced study. There is a
tiny gallstone in the gallbladder with mild sludge. Gallbladder wall
does not appear thickened. There is no biliary duct dilatation.

Pancreas: No pancreatic mass or inflammatory focus.

Spleen: No splenic lesions are evident.

Adrenals/Urinary Tract: Right adrenal appears normal. There is
slight left adrenal hypertrophy. There is mild nephrocalcinosis
bilaterally. There is no mass or hydronephrosis on either side. On
the right, there is a 1 mm calculus in the anterior aspect of the
mid right kidney. There is a 1 mm calculus in the mid right kidney
as well as a 2 mm calculus in the posterior mid right kidney. On the
left, there are multiple 1-2 mm calculi throughout the kidney as
well as a 4 x 3 mm calculus in the upper pole left kidney. There is
no evident mass or hydronephrosis on either side. There is a focal
calculus in the ureterovesical junction on the left measuring 7 x 6
mm which is not causing appreciable hydronephrosis. No other
ureteral calculus is evident. The urinary bladder is midline with
wall thickness within normal limits.

Stomach/Bowel: There are multiple sigmoid diverticula without
diverticulitis. There is no appreciable bowel wall or mesenteric
thickening. No bowel obstruction. No free air or portal venous air
is evident.

Vascular/Lymphatic: There is atherosclerotic calcification in the
aorta and common iliac arteries without evident aneurysm. Major
mesenteric vessels appear patent on this noncontrast enhanced study.
No adenopathy is appreciable in the abdomen or pelvis.

Reproductive: Uterus is absent. No pelvic mass or pelvic fluid
collection.

Other: Appendix absent. No abscess or ascites evident in the abdomen
or pelvis. There is a minimal ventral hernia containing only fat.

Musculoskeletal: There are no blastic or lytic bone lesions. There
is degenerative change in the lower lumbar spine. There is a small
bone island in the left femoral head. There are no intramuscular or
abdominal wall lesions.
IMPRESSION: 7 x 6 mm calculus left ureterovesical junction without appreciable
hydronephrosis. There are multiple intrarenal calculi bilaterally,
more on the left than on the right. There is also nephrocalcinosis
bilaterally. There is no perinephric edema or stranding.

Prominent liver without focal liver lesion. Tiny gallstone as well
as probable sludge in gallbladder. Gallbladder wall not thickened.

Minimal ventral hernia containing only fat.

Aortoiliac atherosclerosis.

No bowel obstruction. No abscess. Appendix absent. Uterus absent.
There are sigmoid diverticula without diverticulitis.

## 2018-12-04 ENCOUNTER — Other Ambulatory Visit: Payer: Self-pay | Admitting: Neurosurgery

## 2018-12-06 NOTE — Pre-Procedure Instructions (Signed)
Magenta Krauter  12/06/2018      [facility] [address] [place] - [address] [place] [address] Phone: [phone] Fax: [phone]    Your procedure is scheduled on March 2  Report to [facility] at  A.M.  Call this number if you have problems the morning of surgery:  [phone]   Remember:  Do not eat or drink after midnight.      Take these medicines the morning of surgery with A SIP OF WATER :              Clonazepam (klonopin) if needed                          7 days prior to surgery STOP taking any Aspirin (unless otherwise instructed by your surgeon), Aleve, Naproxen, Ibuprofen, Motrin, Advil, Goody's, BC's, all herbal medications, fish oil, and all vitamins.    Do not wear jewelry, make-up or nail polish.  Do not wear lotions, powders, or perfumes, or deodorant.  Do not shave 48 hours prior to surgery.  Men may shave face and neck.  Do not bring valuables to the hospital.  [facility] is not responsible for any belongings or valuables.  Contacts, dentures or bridgework may not be worn into surgery.  Leave your suitcase in the car.  After surgery it may be brought to your room.  For patients admitted to the hospital, discharge time will be determined by your treatment team.  Patients discharged the day of surgery will not be allowed to drive home.    Special instructions:   [facility]- Preparing For Surgery  Before surgery, you can play an important role. Because skin is not sterile, your skin needs to be as free of germs as possible. You can reduce the number of germs on your skin by washing with CHG (chlorahexidine gluconate) Soap before surgery.  CHG is an antiseptic cleaner which kills germs and bonds with the skin to continue killing germs even after washing.    Oral Hygiene is also important to reduce your risk of infection.  Remember - BRUSH YOUR TEETH THE MORNING OF SURGERY WITH YOUR REGULAR  TOOTHPASTE  Please do not use if you have an allergy to CHG or antibacterial soaps. If your skin becomes reddened/irritated stop using the CHG.  Do not shave (including legs and underarms) for at least 48 hours prior to first CHG shower. It is OK to shave your face.  Please follow these instructions carefully.   1. Shower the NIGHT BEFORE SURGERY and the MORNING OF SURGERY with CHG.   2. If you chose to wash your hair, wash your hair first as usual with your normal shampoo.  3. After you shampoo, rinse your hair and body thoroughly to remove the shampoo.  4. Use CHG as you would any other liquid soap. You can apply CHG directly to the skin and wash gently with a scrungie or a clean washcloth.   5. Apply the CHG Soap to your body ONLY FROM THE NECK DOWN.  Do not use on open wounds or open sores. Avoid contact with your eyes, ears, mouth and genitals (private parts). Wash Face and genitals (private parts)  with your normal soap.  6. Wash thoroughly, paying special attention to the area where your surgery will be performed.  7. Thoroughly rinse your body with warm water from the neck down.  8.  DO NOT shower/wash with your normal soap after using and rinsing off the CHG Soap.  9. Pat yourself dry with a CLEAN TOWEL.  10. Wear CLEAN PAJAMAS to bed the night before surgery, wear comfortable clothes the morning of surgery  11. Place CLEAN SHEETS on your bed the night of your first shower and DO NOT SLEEP WITH PETS.    Day of Surgery:  Do not apply any deodorants/lotions.  Please wear clean clothes to the hospital/surgery center.   Remember to brush your teeth WITH YOUR REGULAR TOOTHPASTE.    Please read over the following fact sheets that you were given. Coughing and Deep Breathing and Surgical Site Infection Prevention

## 2018-12-07 ENCOUNTER — Encounter (HOSPITAL_COMMUNITY)
Admission: RE | Admit: 2018-12-07 | Discharge: 2018-12-07 | Disposition: A | Discharge status: Home / Self Care | Payer: 59 | Source: Ambulatory Visit | Attending: Neurosurgery | Admitting: Neurosurgery

## 2018-12-07 ENCOUNTER — Encounter (HOSPITAL_COMMUNITY): Payer: Self-pay

## 2018-12-07 ENCOUNTER — Other Ambulatory Visit: Payer: Self-pay

## 2018-12-07 DIAGNOSIS — Z01812 Encounter for preprocedural laboratory examination: Secondary | ICD-10-CM | POA: Diagnosis present

## 2018-12-07 HISTORY — DX: Depression, unspecified: F32.A

## 2018-12-07 HISTORY — DX: Anxiety disorder, unspecified: F41.9

## 2018-12-07 HISTORY — DX: Pneumonia, unspecified organism: J18.9

## 2018-12-07 HISTORY — DX: Major depressive disorder, single episode, unspecified: F32.9

## 2018-12-07 LAB — CBC
HEMATOCRIT: 45.5 % (ref 36.0–46.0)
Hemoglobin: 14.2 g/dL (ref 12.0–15.0)
MCH: 29.8 pg (ref 26.0–34.0)
MCHC: 31.2 g/dL (ref 30.0–36.0)
MCV: 95.6 fL (ref 80.0–100.0)
Platelets: 211 10*3/uL (ref 150–400)
RBC: 4.76 MIL/uL (ref 3.87–5.11)
RDW: 13.7 % (ref 11.5–15.5)
WBC: 9.5 10*3/uL (ref 4.0–10.5)
nRBC: 0 % (ref 0.0–0.2)

## 2018-12-07 NOTE — Progress Notes (Signed)
PCP - Dr Doreen Beam  Cardiologist - no  Chest x-ray - NA  Stress Test - NA  ECHO - no  Cardiac Cath - no  Sleep Study - NO CPAP - NO  LABS- CBC  ASA-no  HA1C-noFasting Blood Sugar -  Checks Blood Sugar ___0__ times a day  Anesthesia-na  Pt denies having chest pain, sob, or fever at this time. All instructions explained to the pt, with a verbal understanding of the material. Pt agrees to go over the instructions while at home for a better understanding. The opportunity to ask questions was provided. Instructed to not smoke for 24 hours prior to surgery.

## 2018-12-07 NOTE — Pre-Procedure Instructions (Signed)
Connie Vargas  12/07/2018     Your procedure is scheduled on Wednesday, March 4.  Report to [facility] at 11:55 AM               Your surgery or procedure is scheduled for  1:59 PM   Call this number if you have problems the morning of surgery:  [phone]  This is the number for the Pre- Surgical Desk.   Remember:  Do not eat or drink after midnight, Tuesday, March 3.      Take these medicines the morning of surgery with A SIP OF WATER :              Clonazepam (klonopin) if needed                           STOP taking any Aspirin (unless otherwise instructed by your surgeon), Aleve, Naproxen, Ibuprofen, Motrin, Advil, Goody's, BC's, all herbal medications, fish oil, and all vitamins.   Special instructions:   [facility]- Preparing For Surgery  Before surgery, you can play an important role. Because skin is not sterile, your skin needs to be as free of germs as possible. You can reduce the number of germs on your skin by washing with CHG (chlorahexidine gluconate) Soap before surgery.  CHG is an antiseptic cleaner which kills germs and bonds with the skin to continue killing germs even after washing.    Oral Hygiene is also important to reduce your risk of infection.  Remember - BRUSH YOUR TEETH THE MORNING OF SURGERY WITH YOUR REGULAR TOOTHPASTE  Please do not use if you have an allergy to CHG or antibacterial soaps. If your skin becomes reddened/irritated stop using the CHG.  Do not shave (including legs and underarms) for at least 48 hours prior to first CHG shower. It is OK to shave your face.  Please follow these instructions carefully.   1. Shower the NIGHT BEFORE SURGERY and the MORNING OF SURGERY with CHG.   2. If you chose to wash your hair, wash your hair first as usual with your normal shampoo.  3. After you shampoo, wash your face and private area with the soap you use at home, then rinse your hair and body thoroughly to remove the shampoo and  soap.   4. Use CHG as you would any other liquid soap. You can apply CHG directly to the skin and wash gently with a scrungie or a clean washcloth.   5. Apply the CHG Soap to your body ONLY FROM THE NECK DOWN.  Do not use on open wounds or open sores. Avoid contact with your eyes, ears, mouth and genitals (private parts).   6. Wash thoroughly, paying special attention to the area where your surgery will be performed.  7. Thoroughly rinse your body with warm water from the neck down.  8. DO NOT shower/wash with your normal soap after using and rinsing off the CHG Soap.  9. Pat yourself dry with a CLEAN TOWEL.  10. Wear CLEAN PAJAMAS to bed the night before surgery, wear comfortable clothes the morning of surgery  11. Place CLEAN SHEETS on your bed the night of your first shower and DO NOT SLEEP WITH PETS.    Day of Surgery:  Do not apply any deodorants/lotions.  Please wear clean clothes to the hospital/surgery center.   Remember to brush your teeth WITH YOUR REGULAR TOOTHPASTE.  Do not wear  jewelry, make-up or nail polish.  Do not wear lotions, powders, or perfumes or deodorant.  Do not shave 48 hours prior to surgery.  Men may shave face and neck.  Do not bring valuables to the hospital.  [facility] is not responsible for any belongings or valuables.  Contacts, dentures or bridgework may not be worn into surgery.  Leave your suitcase in the car.  After surgery it may be brought to your room.  For patients admitted to the hospital, discharge time will be determined by your treatment team.  Please read over the following fact sheets that you were given. Coughing and Deep Breathing and Surgical Site Infection Prevention

## 2018-12-07 NOTE — Pre-Procedure Instructions (Signed)
Connie Vargas  12/07/2018     Your procedure is scheduled on March 2  Report to [facility] at 9:45 AM               Your surgery or procedure is scheduled for 11:45 A.M.   Call this number if you have problems the morning of surgery:  [phone]  This is the number for the Pre- Surgical Desk.   Remember:  Do not eat or drink after midnight, Sunday, March 1.      Take these medicines the morning of surgery with A SIP OF WATER :              Clonazepam (klonopin) if needed                           STOP taking any Aspirin (unless otherwise instructed by your surgeon), Aleve, Naproxen, Ibuprofen, Motrin, Advil, Goody's, BC's, all herbal medications, fish oil, and all vitamins.   Special instructions:   [facility]- Preparing For Surgery  Before surgery, you can play an important role. Because skin is not sterile, your skin needs to be as free of germs as possible. You can reduce the number of germs on your skin by washing with CHG (chlorahexidine gluconate) Soap before surgery.  CHG is an antiseptic cleaner which kills germs and bonds with the skin to continue killing germs even after washing.    Oral Hygiene is also important to reduce your risk of infection.  Remember - BRUSH YOUR TEETH THE MORNING OF SURGERY WITH YOUR REGULAR TOOTHPASTE  Please do not use if you have an allergy to CHG or antibacterial soaps. If your skin becomes reddened/irritated stop using the CHG.  Do not shave (including legs and underarms) for at least 48 hours prior to first CHG shower. It is OK to shave your face.  Please follow these instructions carefully.   1. Shower the NIGHT BEFORE SURGERY and the MORNING OF SURGERY with CHG.   2. If you chose to wash your hair, wash your hair first as usual with your normal shampoo.  3. After you shampoo, wash your face and private area with the soap you use at home, then rinse your hair and body thoroughly to remove the shampoo and soap.   4. Use  CHG as you would any other liquid soap. You can apply CHG directly to the skin and wash gently with a scrungie or a clean washcloth.   5. Apply the CHG Soap to your body ONLY FROM THE NECK DOWN.  Do not use on open wounds or open sores. Avoid contact with your eyes, ears, mouth and genitals (private parts).   6. Wash thoroughly, paying special attention to the area where your surgery will be performed.  7. Thoroughly rinse your body with warm water from the neck down.  8. DO NOT shower/wash with your normal soap after using and rinsing off the CHG Soap.  9. Pat yourself dry with a CLEAN TOWEL.  10. Wear CLEAN PAJAMAS to bed the night before surgery, wear comfortable clothes the morning of surgery  11. Place CLEAN SHEETS on your bed the night of your first shower and DO NOT SLEEP WITH PETS.    Day of Surgery:  Do not apply any deodorants/lotions.  Please wear clean clothes to the hospital/surgery center.   Remember to brush your teeth WITH YOUR REGULAR TOOTHPASTE.  Do not wear jewelry, make-up or  nail polish.  Do not wear lotions, powders, or perfumes or deodorant.  Do not shave 48 hours prior to surgery.  Men may shave face and neck.  Do not bring valuables to the hospital.  [facility] is not responsible for any belongings or valuables.  Contacts, dentures or bridgework may not be worn into surgery.  Leave your suitcase in the car.  After surgery it may be brought to your room.  For patients admitted to the hospital, discharge time will be determined by your treatment team.  Please read over the following fact sheets that you were given. Coughing and Deep Breathing and Surgical Site Infection Prevention

## 2018-12-12 ENCOUNTER — Ambulatory Visit (HOSPITAL_COMMUNITY)
Admission: RE | Admit: 2018-12-12 | Discharge: 2018-12-12 | Disposition: A | Discharge status: Home / Self Care | Payer: 59 | Attending: Neurosurgery | Admitting: Neurosurgery

## 2018-12-12 ENCOUNTER — Ambulatory Visit (HOSPITAL_COMMUNITY): Payer: 59 | Admitting: Certified Registered Nurse Anesthetist

## 2018-12-12 ENCOUNTER — Encounter (HOSPITAL_COMMUNITY): Payer: Self-pay | Admitting: Certified Registered Nurse Anesthetist

## 2018-12-12 ENCOUNTER — Encounter (HOSPITAL_COMMUNITY)
Admission: RE | Disposition: A | Discharge status: Home / Self Care | Payer: Self-pay | Source: Home / Self Care | Attending: Neurosurgery

## 2018-12-12 ENCOUNTER — Other Ambulatory Visit: Payer: Self-pay

## 2018-12-12 DIAGNOSIS — G5601 Carpal tunnel syndrome, right upper limb: Secondary | ICD-10-CM | POA: Diagnosis present

## 2018-12-12 DIAGNOSIS — F419 Anxiety disorder, unspecified: Secondary | ICD-10-CM | POA: Insufficient documentation

## 2018-12-12 DIAGNOSIS — Z79899 Other long term (current) drug therapy: Secondary | ICD-10-CM | POA: Diagnosis not present

## 2018-12-12 DIAGNOSIS — J449 Chronic obstructive pulmonary disease, unspecified: Secondary | ICD-10-CM | POA: Diagnosis not present

## 2018-12-12 DIAGNOSIS — F329 Major depressive disorder, single episode, unspecified: Secondary | ICD-10-CM | POA: Diagnosis not present

## 2018-12-12 DIAGNOSIS — F17210 Nicotine dependence, cigarettes, uncomplicated: Secondary | ICD-10-CM | POA: Insufficient documentation

## 2018-12-12 HISTORY — DX: Nausea with vomiting, unspecified: R11.2

## 2018-12-12 HISTORY — PX: CARPAL TUNNEL RELEASE: SHX101

## 2018-12-12 HISTORY — DX: Other specified postprocedural states: Z98.890

## 2018-12-12 SURGERY — CARPAL TUNNEL RELEASE
Anesthesia: Monitor Anesthesia Care | Laterality: Right

## 2018-12-12 MED ORDER — FENTANYL CITRATE (PF) 100 MCG/2ML IJ SOLN
INTRAMUSCULAR | Status: AC
  Administered 2018-12-12: 50 ug via INTRAVENOUS
  Filled 2018-12-12: qty 2

## 2018-12-12 MED ORDER — ACETAMINOPHEN 500 MG PO TABS
ORAL_TABLET | ORAL | Status: AC
  Administered 2018-12-12: 1000 mg via ORAL
  Filled 2018-12-12: qty 2

## 2018-12-12 MED ORDER — FENTANYL CITRATE (PF) 100 MCG/2ML IJ SOLN
25.0000 ug | INTRAMUSCULAR | Status: DC | PRN
  Administered 2018-12-12 (×2): 50 ug via INTRAVENOUS

## 2018-12-12 MED ORDER — BUPIVACAINE-EPINEPHRINE (PF) 0.25% -1:200000 IJ SOLN
INTRAMUSCULAR | Status: AC
  Filled 2018-12-12: qty 30

## 2018-12-12 MED ORDER — PHENYLEPHRINE 40 MCG/ML (10ML) SYRINGE FOR IV PUSH (FOR BLOOD PRESSURE SUPPORT)
PREFILLED_SYRINGE | INTRAVENOUS | Status: AC
  Filled 2018-12-12: qty 10

## 2018-12-12 MED ORDER — MIDAZOLAM HCL 5 MG/5ML IJ SOLN
INTRAMUSCULAR | Status: DC | PRN
  Administered 2018-12-12: 2 mg via INTRAVENOUS

## 2018-12-12 MED ORDER — LACTATED RINGERS IV SOLN
INTRAVENOUS | Status: DC
  Administered 2018-12-12: 12:00:00 via INTRAVENOUS

## 2018-12-12 MED ORDER — CHLORHEXIDINE GLUCONATE CLOTH 2 % EX PADS: 6.0000 | MEDICATED_PAD | Freq: Once | CUTANEOUS | Status: DC

## 2018-12-12 MED ORDER — LIDOCAINE 2% (20 MG/ML) 5 ML SYRINGE
INTRAMUSCULAR | Status: AC
  Filled 2018-12-12: qty 5

## 2018-12-12 MED ORDER — PROPOFOL 500 MG/50ML IV EMUL
INTRAVENOUS | Status: DC | PRN
  Administered 2018-12-12: 75 ug/kg/min via INTRAVENOUS

## 2018-12-12 MED ORDER — ONDANSETRON HCL 4 MG/2ML IJ SOLN
INTRAMUSCULAR | Status: AC
  Filled 2018-12-12: qty 2

## 2018-12-12 MED ORDER — BACITRACIN ZINC 500 UNIT/GM EX OINT
TOPICAL_OINTMENT | CUTANEOUS | Status: AC
  Filled 2018-12-12: qty 28.35

## 2018-12-12 MED ORDER — ONDANSETRON HCL 4 MG/2ML IJ SOLN
INTRAMUSCULAR | Status: DC | PRN
  Administered 2018-12-12: 4 mg via INTRAVENOUS

## 2018-12-12 MED ORDER — HYDROCODONE-ACETAMINOPHEN 7.5-325 MG PO TABS: 1.0000 | ORAL_TABLET | Freq: Once | ORAL | Status: DC | PRN

## 2018-12-12 MED ORDER — 0.9 % SODIUM CHLORIDE (POUR BTL) OPTIME
TOPICAL | Status: DC | PRN
  Administered 2018-12-12: 1000 mL

## 2018-12-12 MED ORDER — LACTATED RINGERS IV SOLN
INTRAVENOUS | Status: DC | PRN
  Administered 2018-12-12: 12:00:00 via INTRAVENOUS

## 2018-12-12 MED ORDER — PROPOFOL 10 MG/ML IV BOLUS
INTRAVENOUS | Status: AC
  Filled 2018-12-12: qty 20

## 2018-12-12 MED ORDER — ONDANSETRON HCL 4 MG/2ML IJ SOLN
4.0000 mg | Freq: Once | INTRAMUSCULAR | Status: AC | PRN
  Administered 2018-12-12: 4 mg via INTRAVENOUS

## 2018-12-12 MED ORDER — FENTANYL CITRATE (PF) 250 MCG/5ML IJ SOLN
INTRAMUSCULAR | Status: AC
  Filled 2018-12-12: qty 5

## 2018-12-12 MED ORDER — CEFAZOLIN SODIUM-DEXTROSE 2-4 GM/100ML-% IV SOLN
2.0000 g | INTRAVENOUS | Status: AC
  Administered 2018-12-12: 2 g via INTRAVENOUS

## 2018-12-12 MED ORDER — PHENYLEPHRINE 40 MCG/ML (10ML) SYRINGE FOR IV PUSH (FOR BLOOD PRESSURE SUPPORT)
PREFILLED_SYRINGE | INTRAVENOUS | Status: DC | PRN
  Administered 2018-12-12 (×2): 40 ug via INTRAVENOUS

## 2018-12-12 MED ORDER — CEFAZOLIN SODIUM-DEXTROSE 2-4 GM/100ML-% IV SOLN
INTRAVENOUS | Status: AC
  Filled 2018-12-12: qty 100

## 2018-12-12 MED ORDER — ACETAMINOPHEN 500 MG PO TABS
1000.0000 mg | ORAL_TABLET | Freq: Once | ORAL | Status: AC
  Administered 2018-12-12: 1000 mg via ORAL

## 2018-12-12 MED ORDER — BUPIVACAINE-EPINEPHRINE (PF) 0.25% -1:200000 IJ SOLN
INTRAMUSCULAR | Status: DC | PRN
  Administered 2018-12-12: 6 mL via PERINEURAL

## 2018-12-12 MED ORDER — MEPERIDINE HCL 50 MG/ML IJ SOLN: 6.2500 mg | INTRAMUSCULAR | Status: DC | PRN

## 2018-12-12 MED ORDER — MIDAZOLAM HCL 2 MG/2ML IJ SOLN
INTRAMUSCULAR | Status: AC
  Filled 2018-12-12: qty 2

## 2018-12-12 MED ORDER — BACITRACIN ZINC 500 UNIT/GM EX OINT
TOPICAL_OINTMENT | CUTANEOUS | Status: DC | PRN
  Administered 2018-12-12: 1 via TOPICAL

## 2018-12-12 MED ORDER — FENTANYL CITRATE (PF) 250 MCG/5ML IJ SOLN
INTRAMUSCULAR | Status: DC | PRN
  Administered 2018-12-12 (×2): 50 ug via INTRAVENOUS
  Administered 2018-12-12: 25 ug via INTRAVENOUS
  Administered 2018-12-12: 50 ug via INTRAVENOUS
  Administered 2018-12-12: 25 ug via INTRAVENOUS
  Administered 2018-12-12: 50 ug via INTRAVENOUS

## 2018-12-12 SURGICAL SUPPLY — 42 items
BAG DECANTER FOR FLEXI CONT (MISCELLANEOUS) ×3 IMPLANT
BANDAGE ACE 4X5 VEL STRL LF (GAUZE/BANDAGES/DRESSINGS) ×3 IMPLANT
BANDAGE GAUZE 4  KLING STR (GAUZE/BANDAGES/DRESSINGS) ×3 IMPLANT
BLADE SURG 15 STRL LF DISP TIS (BLADE) ×1 IMPLANT
BLADE SURG 15 STRL SS (BLADE) ×2
BNDG STRETCH 4X75 NS LF (GAUZE/BANDAGES/DRESSINGS) ×2 IMPLANT
CABLE BIPOLOR RESECTION CORD (MISCELLANEOUS) ×3 IMPLANT
CARTRIDGE OIL MAESTRO DRILL (MISCELLANEOUS) IMPLANT
COVER WAND RF STERILE (DRAPES) IMPLANT
DIFFUSER DRILL AIR PNEUMATIC (MISCELLANEOUS) IMPLANT
DRAPE EXTREMITY T 121X128X90 (DISPOSABLE) ×3 IMPLANT
DRAPE HALF SHEET 40X57 (DRAPES) ×6 IMPLANT
GAUZE 4X4 16PLY RFD (DISPOSABLE) ×3 IMPLANT
GAUZE SPONGE 4X4 12PLY STRL (GAUZE/BANDAGES/DRESSINGS) ×3 IMPLANT
GLOVE BIO SURGEON STRL SZ8 (GLOVE) ×3 IMPLANT
GLOVE BIO SURGEON STRL SZ8.5 (GLOVE) ×3 IMPLANT
GLOVE BIOGEL PI IND STRL 7.0 (GLOVE) ×1 IMPLANT
GLOVE BIOGEL PI INDICATOR 7.0 (GLOVE) ×2
GLOVE EXAM NITRILE XL STR (GLOVE) IMPLANT
GLOVE INDICATOR 7.0 STRL GRN (GLOVE) ×3 IMPLANT
GOWN STRL REUS W/ TWL LRG LVL3 (GOWN DISPOSABLE) ×1 IMPLANT
GOWN STRL REUS W/ TWL XL LVL3 (GOWN DISPOSABLE) ×1 IMPLANT
GOWN STRL REUS W/TWL LRG LVL3 (GOWN DISPOSABLE) ×2
GOWN STRL REUS W/TWL XL LVL3 (GOWN DISPOSABLE) ×2
KIT BASIN OR (CUSTOM PROCEDURE TRAY) ×3 IMPLANT
KIT TURNOVER KIT B (KITS) ×3 IMPLANT
MARKER SKIN DUAL TIP RULER LAB (MISCELLANEOUS) ×3 IMPLANT
NEEDLE HYPO 25X1 1.5 SAFETY (NEEDLE) ×3 IMPLANT
NS IRRIG 1000ML POUR BTL (IV SOLUTION) ×3 IMPLANT
OIL CARTRIDGE MAESTRO DRILL (MISCELLANEOUS)
PACK SURGICAL SETUP 50X90 (CUSTOM PROCEDURE TRAY) ×3 IMPLANT
PAD ARMBOARD 7.5X6 YLW CONV (MISCELLANEOUS) ×3 IMPLANT
STOCKINETTE 4X48 STRL (DRAPES) ×3 IMPLANT
SUT ETHILON 3 0 PS 1 (SUTURE) ×3 IMPLANT
SUT VIC AB 3-0 SH 8-18 (SUTURE) ×3 IMPLANT
SYR BULB 3OZ (MISCELLANEOUS) ×3 IMPLANT
SYR CONTROL 10ML LL (SYRINGE) ×3 IMPLANT
TOWEL GREEN STERILE FF (TOWEL DISPOSABLE) ×9 IMPLANT
TUBE CONNECTING 12'X1/4 (SUCTIONS) ×1
TUBE CONNECTING 12X1/4 (SUCTIONS) ×2 IMPLANT
UNDERPAD 30X30 (UNDERPADS AND DIAPERS) ×3 IMPLANT
WATER STERILE IRR 1000ML POUR (IV SOLUTION) ×3 IMPLANT

## 2018-12-12 NOTE — H&P (Signed)
Subjective: The patient is a 53 year old white female patient of Dr. Jackelyn Knife. She has complained of pain in her right hand, more on the ulnar aspect. She was worked up with NCV EMGs which demonstrated severe right carpal tunnel syndrome without evidence of ulnar neuropathy. Dr. Mikal Plane discussed a carpal tunnel release with her. The patient would like to proceed with surgery as soon as possible.  Dr. Franky Macho is out of town.  I discussed the situation with the patient.  I have told her that her clinical symptoms are not particularly consistent with carpal tunnel syndrome but that her nerve studies have demonstrated severe right carpal tunnel syndrome.  We discussed whether or not to proceed with a right carpal tunnel release.  I described the surgery to her and the risks.  She has decided to proceed with the surgery.  Past Medical History:  Diagnosis Date  . Anxiety   . COPD (chronic obstructive pulmonary disease) (HCC)   . Depression   . Family history of adverse reaction to anesthesia    mother--  wakes up during surgery yrs ago  . Frequency of urination   . GERD (gastroesophageal reflux disease)   . Glaucoma    pre  . Hematuria   . History of gastric ulcer    1990's  . History of kidney stones    numerous  . Left flank pain   . Left ureteral stone   . Nephrolithiasis    BILATERAL RENAL CALCULI-- LEFT > RIGHT PER CT 01-09-2017  . Pneumonia   . PONV (postoperative nausea and vomiting)   . Urgency of urination   . Wears contact lenses     Past Surgical History:  Procedure Laterality Date  . APPENDECTOMY  2002  . CESAREAN SECTION  2004  . COLONOSCOPY    . CYSTOSCOPY/RETROGRADE/URETEROSCOPY/STONE EXTRACTION WITH BASKET  1998  . DILATION AND CURETTAGE OF UTERUS  x2  last one 2002  . FOOT SURGERY Bilateral age 67   "removal of extra bone"  . PLACEMENT TITANIUM PLUNG IN LEFT BREAST  2010   FOR MAMMOGRAMS  . TONSILLECTOMY AND ADENOIDECTOMY  age 21  . URETEROSCOPY Left 01/13/2017   Procedure: URETEROSCOPY c laser;  Surgeon: Bjorn Pippin, MD;  Location: [facility];  Service: Urology;  Laterality: Left;  left ureteroscopy with laser laser c arm  . VAGINAL HYSTERECTOMY  2010    Allergies  Allergen Reactions  . Other Other (See Comments)    Bell peppers-- "mouth tingles and goes numb"  . Prednisone Other (See Comments)    Psychosis - can not sleep for 3 or 4 days, see and hear things     Social History   Tobacco Use  . Smoking status: Current Every Day Smoker    Packs/day: 0.50    Years: 40.00    Pack years: 20.00    Types: Cigarettes  . Smokeless tobacco: Never Used  Substance Use Topics  . Alcohol use: Not Currently    Comment: occasional    Family History  Problem Relation Age of Onset  . Depression Mother   . Bipolar disorder Father    Prior to Admission medications   Medication Sig Start Date End Date Taking? Authorizing Provider  clonazePAM (KLONOPIN) 0.5 MG tablet Take 0.5 mg by mouth 2 (two) times daily as needed for anxiety.   Yes [provider]  escitalopram (LEXAPRO) 20 MG tablet Take 20 mg by mouth at bedtime.    Yes [provider]  Multiple Vitamins-Minerals (MULTIVITAMIN  WITH MINERALS) tablet Take 1 tablet by mouth at bedtime.    Yes [provider]  NONFORMULARY OR COMPOUNDED ITEM Apply 1 application topically 2 (two) times daily as needed (pain). Ketoprofen and Lidocaine 10 - 5%   Yes [provider]     Review of Systems  Positive ROS: As above  All other systems have been reviewed and were otherwise negative with the exception of those mentioned in the HPI and as above.  Objective: Vital signs in last 24 hours: Temp:  [97.9 F (36.6 C)] 97.9 F (36.6 C) (03/04 1139) Pulse Rate:  [84] 84 (03/04 1139) Resp:  [18] 18 (03/04 1139) BP: (132)/(50) 132/50 (03/04 1139) SpO2:  [99 %] 99 % (03/04 1139) Weight:  [50.8 kg] 50.8 kg (03/04 1139) Estimated body mass index is 19.84 kg/m as  calculated from the following:   Height as of this encounter: 5\' 3"  (1.6 m).   Weight as of this encounter: 50.8 kg.   Physical exam:  General: An alert and pleasant 53 year old white female in no apparent distress  HEENT unremarkable  Thorax symmetric  Abdomen soft  Extremities unremarkable.  The patient's neurologic exam is unremarkable.  Her hand strength is normal.  I have reviewed the patient's NCV EMGs.  It demonstrates severe right carpal tunnel syndrome   Data Review Lab Results  Component Value Date   WBC 9.5 12/07/2018   HGB 14.2 12/07/2018   HCT 45.5 12/07/2018   MCV 95.6 12/07/2018   PLT 211 12/07/2018   No results found for: NA, K, CL, CO2, BUN, CREATININE, GLUCOSE No results found for: INR, PROTIME  Assessment/Plan: Right carpal tunnel syndrome: I have discussed the situation with the patient.  I told her that her electrodiagnostic testing demonstrates severe right carpal tunnel syndrome but her symptoms are not particularly consistent with that.  I told her I did not expect carpal tunnel release to relieve symptoms in her right fourth and fifth digit.  We discussed whether or not to proceed with surgery.  I described a right carpal tunnel release and the risks, benefits, alternatives, expected postoperative course and the likelihood of achieving our goals with surgery.  I have answered all her questions.  She has decided to proceed with right carpal tunnel release.  Cristi Loron 12/12/2018 1:07 PM

## 2018-12-12 NOTE — Anesthesia Procedure Notes (Signed)
Procedure Name: MAC Date/Time: 12/12/2018 1:15 PM Performed by: Harden Mo, CRNA Pre-anesthesia Checklist: Patient identified, Emergency Drugs available, Suction available and Patient being monitored Oxygen Delivery Method: Simple face mask Preoxygenation: Pre-oxygenation with 100% oxygen Induction Type: IV induction Placement Confirmation: positive ETCO2 and breath sounds checked- equal and bilateral Dental Injury: Teeth and Oropharynx as per pre-operative assessment

## 2018-12-12 NOTE — Transfer of Care (Signed)
Immediate Anesthesia Transfer of Care Note  Patient: Connie Vargas  Procedure(s) Performed: Right Carpal tunnel release (Right )  Patient Location: PACU  Anesthesia Type:MAC  Level of Consciousness: awake, alert  and oriented  Airway & Oxygen Therapy: Patient Spontanous Breathing  Post-op Assessment: Report given to RN, Post -op Vital signs reviewed and stable and Patient moving all extremities X 4  Post vital signs: Reviewed and stable  Last Vitals:  Vitals Value Taken Time  BP 112/68 12/12/2018  2:04 PM  Temp    Pulse 85 12/12/2018  2:05 PM  Resp 8 12/12/2018  2:05 PM  SpO2 98 % 12/12/2018  2:05 PM  Vitals shown include unvalidated device data.  Last Pain:  Vitals:   12/12/18 1218  TempSrc:   PainSc: 0-No pain      Patients Stated Pain Goal: 2 (34/28/76 8115)  Complications: No apparent anesthesia complications

## 2018-12-12 NOTE — Anesthesia Postprocedure Evaluation (Signed)
Anesthesia Post Note  Patient: Connie Vargas  Procedure(s) Performed: Right Carpal tunnel release (Right )     Patient location during evaluation: PACU Anesthesia Type: MAC Level of consciousness: awake and alert Pain management: pain level controlled Vital Signs Assessment: post-procedure vital signs reviewed and stable Respiratory status: spontaneous breathing, nonlabored ventilation, respiratory function stable and patient connected to nasal cannula oxygen Cardiovascular status: stable and blood pressure returned to baseline Postop Assessment: no apparent nausea or vomiting Anesthetic complications: no    Last Vitals:  Vitals:   12/12/18 1505 12/12/18 1519  BP:  127/76  Pulse:  69  Resp:  20  Temp: 36.5 C   SpO2:  100%    Last Pain:  Vitals:   12/12/18 1505  TempSrc:   PainSc: 0-No pain                 Ryan P Ellender

## 2018-12-12 NOTE — Op Note (Signed)
Brief history: The patient is a 53 year old white female who has complained of right hand pain.  She was worked up with NCV EMGs which demonstrated right carpal tunnel syndrome.  Dr. Christella Noa and I discussed the various treatment options with her.  I have described a right carpal tunnel release, the risks, benefits, alternatives, expected postoperative course, and likelihood of achieving our goals with surgery.  She has decided to proceed with right carpal tunnel release.  Preoperative diagnosis: Right carpal tunnel syndrome  Postoperative diagnosis: The same  Procedure: Right median nerve neural lysis at the wrist  Surgeon: Dr. Earle Gell  Asst.: None  Anesthesia: MAC  Estimated blood loss: Minimal  Drains: None  Complications: None  Description of procedure: The patient was brought to the operating room by the anesthesia team. MAC anesthesia was induced. The patient remained in the supine position. The patient's right upper extremity was prepared with Betadine scrub and Betadine solution. Sterile drapes were applied.  I then injected the area to be incised with Marcaine solution. I then used a scalpel to make an incision in the palmar crease. I then dissected with the scalpel deeper and incised the superficial fascia. We inserted the Weitlanter retractor for exposure. We then identified the transverse carpal ligament. I carefully incised with a 15 blade scalpel. We then identified the median nerve. I used the Metzenbaum scissors to divide the  transverse carpal ligament both proximally and distally staying along the ulnar aspect of the median nerve. We got a good decompression of the nerve.  I then obtained hemostasis using bipolar electrocautery. We then irrigated the wound out with bacitracin solution. I then removed the retractor. We then reapproximated patient's subcutaneous tissue with interrupted 3-0 Vicryl suture. The skin was reapproximated with a running 3-0 nylon suture. The  wound was then covered with bacitracin ointment. A sterile dressing was applied. The drapes were removed. Patient was subsequently transported to the post anesthesia care unit in stable condition. All sponge instrument and needle counts were reportedly correct at the end of this case.

## 2018-12-12 NOTE — Anesthesia Preprocedure Evaluation (Addendum)
Anesthesia Evaluation  Patient identified by MRN, date of birth, ID band Patient awake    Reviewed: Allergy & Precautions, NPO status , Patient's Chart, lab work & pertinent test results  History of Anesthesia Complications (+) PONV, Family history of anesthesia reaction and history of anesthetic complications  Airway Mallampati: I  TM Distance: >3 FB Neck ROM: Full    Dental  (+) Missing, Caps, Dental Advisory Given,    Pulmonary pneumonia, resolved, COPD, Current Smoker,    Pulmonary exam normal breath sounds clear to auscultation       Cardiovascular negative cardio ROS   Rhythm:Regular Rate:Normal     Neuro/Psych PSYCHIATRIC DISORDERS Anxiety Depression Right CTS    GI/Hepatic Neg liver ROS, PUD, GERD  ,  Endo/Other  negative endocrine ROS  Renal/GU Renal diseaseHx/o renal calculi  negative genitourinary   Musculoskeletal negative musculoskeletal ROS (+)   Abdominal   Peds  Hematology negative hematology ROS (+)   Anesthesia Other Findings Carpal tunnel syndrome  Reproductive/Obstetrics                            Anesthesia Physical Anesthesia Plan  ASA: II  Anesthesia Plan: MAC   Post-op Pain Management:    Induction: Intravenous  PONV Risk Score and Plan: 1 and Propofol infusion and Treatment may vary due to age or medical condition  Airway Management Planned: Simple Face Mask  Additional Equipment:   Intra-op Plan:   Post-operative Plan:   Informed Consent: I have reviewed the patients History and Physical, chart, labs and discussed the procedure including the risks, benefits and alternatives for the proposed anesthesia with the patient or authorized representative who has indicated his/her understanding and acceptance.     Dental advisory given  Plan Discussed with: CRNA and Surgeon  Anesthesia Plan Comments:        Anesthesia Quick Evaluation

## 2018-12-13 ENCOUNTER — Encounter (HOSPITAL_COMMUNITY): Payer: Self-pay | Admitting: Neurosurgery

## 2019-03-06 ENCOUNTER — Telehealth: Payer: Self-pay | Admitting: Obstetrics and Gynecology

## 2019-03-06 NOTE — Telephone Encounter (Signed)
Discussed her daughter's pelvic pain, placed the daughter back on [employer]. F/u by televisit in 6wk.

## 2019-03-06 NOTE — Telephone Encounter (Signed)
Pt returning call to Dr. Emelda Fear.
